# Patient Record
Sex: Male | Born: 1955 | Race: White | Hispanic: No | Marital: Single | Smoking: Former smoker
Health system: Southern US, Community
[De-identification: ages and names within clinical notes are randomized; demographics above are authoritative.]

## PROBLEM LIST (undated history)

## (undated) DIAGNOSIS — F17200 Nicotine dependence, unspecified, uncomplicated: Secondary | ICD-10-CM

## (undated) DIAGNOSIS — M109 Gout, unspecified: Secondary | ICD-10-CM

## (undated) HISTORY — DX: Gout, unspecified: M10.9

## (undated) HISTORY — DX: Nicotine dependence, unspecified, uncomplicated: F17.200

---

## 2016-10-08 ENCOUNTER — Encounter: Payer: Self-pay | Admitting: Family Medicine

## 2016-10-08 ENCOUNTER — Ambulatory Visit (INDEPENDENT_AMBULATORY_CARE_PROVIDER_SITE_OTHER): Payer: BLUE CROSS/BLUE SHIELD | Admitting: Family Medicine

## 2016-10-08 VITALS — BP 138/96 | HR 84 | Temp 99.4°F | Wt 206.8 lb

## 2016-10-08 DIAGNOSIS — J111 Influenza due to unidentified influenza virus with other respiratory manifestations: Secondary | ICD-10-CM

## 2016-10-08 DIAGNOSIS — Z720 Tobacco use: Secondary | ICD-10-CM

## 2016-10-08 NOTE — Progress Notes (Signed)
   Subjective:    Patient ID: Steve Jones, male    DOB: 1956-05-07, 61 y.o.   MRN: [id number]  HPI This is a 61 yo male who presents today abrupt onset 5 days ago of fever (subjective), fatigue and muscle aches. His girlfriend had a course of Tamiflu which he started the day his symptoms began. Was feeling a little better 3 days ago then felt worse. Feels a little better today. Smokes and drinks on Fridays. Smokes less than 1 ppd, has been contemplating quitting.  Rarely gets ill, no hospitalizations or surgeries. Is establishing care with Debb[facility] Alaregina Bailty later this month.   No past medical history on file. No past surgical history on file. No family history on file. Social History  Substance Use Topics  . Smoking status: Current Every Day Smoker  . Smokeless tobacco: Former NeurosurgeonUser  . Alcohol use Yes      Review of Systems  Constitutional: Positive for appetite change, chills, fatigue and fever.  HENT: Positive for rhinorrhea.   Respiratory: Positive for cough (little sputum, occasional cough) and wheezing (at beginning of illness, resolved now). Negative for shortness of breath.   Cardiovascular: Negative for chest pain.  Gastrointestinal: Positive for nausea. Negative for diarrhea and vomiting.  Neurological: Negative for headaches.       Objective:   Physical Exam  Constitutional: He is oriented to person, place, and time. He appears well-developed and well-nourished. No distress.  HENT:  Head: Normocephalic and atraumatic.  Right Ear: External ear normal.  Left Ear: External ear normal.  Nose: Nose normal.  Mouth/Throat: Oropharynx is clear and moist. No oropharyngeal exudate.  Eyes: Conjunctivae are normal.  Neck: Normal range of motion. Neck supple.  Cardiovascular: Normal rate, regular rhythm and normal heart sounds.   Pulmonary/Chest: Effort normal and breath sounds normal. No respiratory distress. He has no wheezes. He has no rales.  Musculoskeletal: Normal range  of motion.  Neurological: He is alert and oriented to person, place, and time.  Skin: Skin is warm and dry. He is not diaphoretic.  Psychiatric: He has a normal mood and affect. His behavior is normal. Judgment and thought content normal.  Vitals reviewed.     BP (!) 138/96   Pulse 84   Temp 99.4 F (37.4 C) (Oral)   Wt 206 lb 12 oz (93.8 kg)   SpO2 95%      Assessment & Plan:  1. Influenza - Provided written and verbal information regarding diagnosis and treatment. - finish Tamiflu - symptomatic treatment discussed - RTC if signs/symptoms of secondary infection- fever, increased sputum, SOB/wheeze  2. Tobacco abuse disorder - discussed smoking cessation and encouraged him to quit - provided written tips for quitting   Olean Reeeborah Gessner, FNP-BC  [facility] [place], [facility]  10/08/2016 10:34 AM

## 2016-10-08 NOTE — Patient Instructions (Signed)
Influenza, Adult Influenza, more commonly known as "the flu," is a viral infection that primarily affects the respiratory tract. The respiratory tract includes organs that help you breathe, such as the lungs, nose, and throat. The flu causes many common cold symptoms, as well as a high fever and body aches. The flu spreads easily from person to person (is contagious). Getting a flu shot (influenza vaccination) every year is the best way to prevent influenza. What are the causes? Influenza is caused by a virus. You can catch the virus by:  Breathing in droplets from an infected person's cough or sneeze.  Touching something that was recently contaminated with the virus and then touching your mouth, nose, or eyes.  What increases the risk? The following factors may make you more likely to get the flu:  Not cleaning your hands frequently with soap and water or alcohol-based hand sanitizer.  Having close contact with many people during cold and flu season.  Touching your mouth, eyes, or nose without washing or sanitizing your hands first.  Not drinking enough fluids or not eating a healthy diet.  Not getting enough sleep or exercise.  Being under a high amount of stress.  Not getting a yearly (annual) flu shot.  You may be at a higher risk of complications from the flu, such as a severe lung infection (pneumonia), if you:  Are over the age of 65.  Are pregnant.  Have a weakened disease-fighting system (immune system). You may have a weakened immune system if you: ? Have HIV or AIDS. ? Are undergoing chemotherapy. ? Aretaking medicines that reduce the activity of (suppress) the immune system.  Have a long-term (chronic) illness, such as heart disease, kidney disease, diabetes, or lung disease.  Have a liver disorder.  Are obese.  Have anemia.  What are the signs or symptoms? Symptoms of this condition typically last 4-10 days and may  include:  Fever.  Chills.  Headache, body aches, or muscle aches.  Sore throat.  Cough.  Runny or congested nose.  Chest discomfort and cough.  Poor appetite.  Weakness or tiredness (fatigue).  Dizziness.  Nausea or vomiting.  How is this diagnosed? This condition may be diagnosed based on your medical history and a physical exam. Your health care provider may do a nose or throat swab test to confirm the diagnosis. How is this treated? If influenza is detected early, you can be treated with antiviral medicine that can reduce the length of your illness and the severity of your symptoms. This medicine may be given by mouth (orally) or through an IV tube that is inserted in one of your veins. The goal of treatment is to relieve symptoms by taking care of yourself at home. This may include taking over-the-counter medicines, drinking plenty of fluids, and adding humidity to the air in your home. In some cases, influenza goes away on its own. Severe influenza or complications from influenza may be treated in a hospital. Follow these instructions at home:  Take over-the-counter and prescription medicines only as told by your health care provider.  Use a cool mist humidifier to add humidity to the air in your home. This can make breathing easier.  Rest as needed.  Drink enough fluid to keep your urine clear or pale yellow.  Cover your mouth and nose when you cough or sneeze.  Wash your hands with soap and water often, especially after you cough or sneeze. If soap and water are not available, use hand sanitizer.    work or school as told by your health care provider. Unless you are visiting your health care provider, try to avoid leaving home until your fever has been gone for 24 hours without the use of medicine.  Keep all follow-up visits as told by your health care provider. This is important. How is this prevented?  Getting an annual flu shot is the best way to avoid getting the flu. You may get the flu shot  in late summer, fall, or winter. Ask your health care provider when you should get your flu shot.  Wash your hands often or use hand sanitizer often.  Avoid contact with people who are sick during cold and flu season.  Eat a healthy diet, drink plenty of fluids, get enough sleep, and exercise regularly. Contact a health care provider if:  You develop new symptoms.  You have:  Chest pain.  Diarrhea.  A fever.  Your cough gets worse.  You produce more mucus.  You feel nauseous or you vomit. Get help right away if:  You develop shortness of breath or difficulty breathing.  Your skin or nails turn a bluish color.  You have severe pain or stiffness in your neck.  You develop a sudden headache or sudden pain in your face or ear.  You cannot stop vomiting. This information is not intended to replace advice given to you by your health care provider. Make sure you discuss any questions you have with your health care provider. Document Released: 07/20/2000 Document Revised: 12/29/2015 Document Reviewed: 05/17/2015 Elsevier Interactive Patient Education  2017 [employer].  Steps to Quit Smoking Smoking tobacco can be harmful to your health and can affect almost every organ in your body. Smoking puts you, and those around you, at risk for developing many serious chronic diseases. Quitting smoking is difficult, but it is one of the best things that you can do for your health. It is never too late to quit. What are the benefits of quitting smoking? When you quit smoking, you lower your risk of developing serious diseases and conditions, such as:  Lung cancer or lung disease, such as COPD.  Heart disease.  Stroke.  Heart attack.  Infertility.  Osteoporosis and bone fractures. Additionally, symptoms such as coughing, wheezing, and shortness of breath may get better when you quit. You may also find that you get sick less often because your body is stronger at fighting off  colds and infections. If you are pregnant, quitting smoking can help to reduce your chances of having a baby of low birth weight. How do I get ready to quit? When you decide to quit smoking, create a plan to make sure that you are successful. Before you quit:  Pick a date to quit. Set a date within the next two weeks to give you time to prepare.  Write down the reasons why you are quitting. Keep this list in places where you will see it often, such as on your bathroom mirror or in your car or wallet.  Identify the people, places, things, and activities that make you want to smoke (triggers) and avoid them. Make sure to take these actions:  Throw away all cigarettes at home, at work, and in your car.  Throw away smoking accessories, such as Set designer.  Clean your car and make sure to empty the ashtray.  Clean your home, including curtains and carpets.  Tell your family, friends, and coworkers that you are quitting. Support from your loved ones can  make quitting easier.  Talk with your health care provider about your options for quitting smoking.  Find out what treatment options are covered by your health insurance. What strategies can I use to quit smoking? Talk with your healthcare provider about different strategies to quit smoking. Some strategies include:  Quitting smoking altogether instead of gradually lessening how much you smoke over a period of time. Research shows that quitting "cold [place]key" is more successful than gradually quitting.  Attending in-person counseling to help you build problem-solving skills. You are more likely to have success in quitting if you attend several counseling sessions. Even short sessions of 10 minutes can be effective.  Finding resources and support systems that can help you to quit smoking and remain smoke-free after you quit. These resources are most helpful when you use them often. They can include:  Online chats with a  Veterinary surgeoncounselor.  Telephone quitlines.  Printed Materials engineerself-help materials.  Support groups or group counseling.  Text messaging programs.  Mobile phone applications.  Taking medicines to help you quit smoking. (If you are pregnant or breastfeeding, talk with your health care provider first.) Some medicines contain nicotine and some do not. Both types of medicines help with cravings, but the medicines that include nicotine help to relieve withdrawal symptoms. Your health care provider may recommend:  Nicotine patches, gum, or lozenges.  Nicotine inhalers or sprays.  Non-nicotine medicine that is taken by mouth. Talk with your health care provider about combining strategies, such as taking medicines while you are also receiving in-person counseling. Using these two strategies together makes you more likely to succeed in quitting than if you used either strategy on its own. If you are pregnant or breastfeeding, talk with your health care provider about finding counseling or other support strategies to quit smoking. Do not take medicine to help you quit smoking unless told to do so by your health care provider. What things can I do to make it easier to quit? Quitting smoking might feel overwhelming at first, but there is a lot that you can do to make it easier. Take these important actions:  Reach out to your family and friends and ask that they support and encourage you during this time. Call telephone quitlines, reach out to support groups, or work with a counselor for support.  Ask people who smoke to avoid smoking around you.  Avoid places that trigger you to smoke, such as bars, parties, or smoke-break areas at work.  Spend time around people who do not smoke.  Lessen stress in your life, because stress can be a smoking trigger for some people. To lessen stress, try:  Exercising regularly.  Deep-breathing exercises.  Yoga.  Meditating.  Performing a body scan. This involves closing  your eyes, scanning your body from head to toe, and noticing which parts of your body are particularly tense. Purposefully relax the muscles in those areas.  Download or purchase mobile phone or tablet apps (applications) that can help you stick to your quit plan by providing reminders, tips, and encouragement. There are many free apps, such as QuitGuide from the [employer] developer(Centers for Disease Control and Prevention). You can find other support for quitting smoking (smoking cessation) through smokefree.gov and other websites. How will I feel when I quit smoking? Within the first 24 hours of quitting smoking, you may start to feel some withdrawal symptoms. These symptoms are usually most noticeable 2-3 days after quitting, but they usually do not last beyond 2-3 weeks. Changes or  symptoms that you might experience include:  Mood swings.  Restlessness, anxiety, or irritation.  Difficulty concentrating.  Dizziness.  Strong cravings for sugary foods in addition to nicotine.  Mild weight gain.  Constipation.  Nausea.  Coughing or a sore throat.  Changes in how your medicines work in your body.  A depressed mood.  Difficulty sleeping (insomnia). After the first 2-3 weeks of quitting, you may start to notice more positive results, such as:  Improved sense of smell and taste.  Decreased coughing and sore throat.  Slower heart rate.  Lower blood pressure.  Clearer skin.  The ability to breathe more easily.  Fewer sick days. Quitting smoking is very challenging for most people. Do not get discouraged if you are not successful the first time. Some people need to make many attempts to quit before they achieve long-term success. Do your best to stick to your quit plan, and talk with your health care provider if you have any questions or concerns. This information is not intended to replace advice given to you by your health care provider. Make sure you discuss any questions you have with  your health care provider. Document Released: 07/17/2001 Document Revised: 03/20/2016 Document Reviewed: 12/07/2014 Elsevier Interactive Patient Education  2017 [employer].

## 2016-10-08 NOTE — Progress Notes (Signed)
Pre visit review using our clinic review tool, if applicable. No additional management support is needed unless otherwise documented below in the visit note. 

## 2016-10-19 ENCOUNTER — Ambulatory Visit: Payer: Self-pay | Admitting: Internal Medicine

## 2017-04-09 ENCOUNTER — Ambulatory Visit (INDEPENDENT_AMBULATORY_CARE_PROVIDER_SITE_OTHER): Payer: BLUE CROSS/BLUE SHIELD | Admitting: Family Medicine

## 2017-04-09 ENCOUNTER — Encounter: Payer: Self-pay | Admitting: Family Medicine

## 2017-04-09 VITALS — BP 136/90 | HR 87 | Temp 98.8°F | Ht 71.0 in | Wt 213.0 lb

## 2017-04-09 DIAGNOSIS — Z0001 Encounter for general adult medical examination with abnormal findings: Secondary | ICD-10-CM | POA: Diagnosis not present

## 2017-04-09 DIAGNOSIS — M109 Gout, unspecified: Secondary | ICD-10-CM

## 2017-04-09 DIAGNOSIS — Z7189 Other specified counseling: Secondary | ICD-10-CM

## 2017-04-09 DIAGNOSIS — F17200 Nicotine dependence, unspecified, uncomplicated: Secondary | ICD-10-CM

## 2017-04-09 NOTE — Patient Instructions (Signed)
Check with your insurance to see if they will cover the shingles shot. I would get a flu shot each fall.   Get a tetanus shot either here or at the pharmacy.   Let us know when you want to get set up for a colonoscopy.   Get me a copy of your old labs and we'll go from there.   Let me know if you want HIV or hep C screening.  We offer that for patients.   Take care.  Glad to see you.

## 2017-04-09 NOTE — Progress Notes (Signed)
CPE- See plan.  Routine anticipatory guidance given to patient.  See health maintenance.  The possibility exists that previously documented standard health maintenance information may have been brought forward from a previous encounter into this note.  If needed, that same information has been updated to reflect the current situation based on today's encounter.    Tetanus d/w pt.  He can check on coverage.  Encouraged. Flu usually done at work, encouraged.  Pneumonia vaccine due at 65. Shingles vaccination discussed with patient. See after visit summary. D/w patient [id number]:options for colon cancer screening, including IFOB vs. colonoscopy.  Risks and benefits of both were discussed and patient voiced understanding.  Pt elects to consider. He did not commit. Family history noted. Encouraged colonoscopy given his family history. Rationale discussed with patient. Prostate cancer screening and PSA options (with potential risks and benefits of testing vs not testing) were discussed along with recent recs/guidelines.  He declined testing PSA at this point. Diet and exercise discussed with patient. Encouraged healthy diet. Encouraged routine exercise. Living will discussed with patient. Girlfriend Cinda Questerri Overbey designated if patient were incapacitated. Pt opts to consider HIV screening.  D/w pt re: routine screening.   Pt opts to consider HCV screening.  D/w pt re: routine screening.   Discussed with patient about smoking cessation and tapering alcohol. He tends to smoke more when he drinks alcohol, usually on Fridays after work.  History of gout. No recent flares. Overall fewer symptoms when he had made previous diet changes. He has chronic gouty changes noted on the elbows and knees.  Erectile dysfunction noted by patient. Likely made worse by alcohol and tobacco use.  PMH and SH reviewed  Meds, vitals, and allergies reviewed.   ROS: Per HPI.  Unless specifically indicated otherwise in HPI, the patient  denies:  General: fever. Eyes: acute vision changes ENT: sore throat Cardiovascular: chest pain Respiratory: SOB GI: vomiting GU: dysuria Musculoskeletal: acute back pain Derm: acute rash Neuro: acute motor dysfunction Psych: worsening mood Endocrine: polydipsia Heme: bleeding Allergy: hayfever  GEN: nad, alert and oriented HEENT: mucous membranes moist NECK: supple w/o LA CV: rrr. PULM: ctab, no inc wob ABD: soft, +bs EXT: no edema SKIN: no acute rash Chronic gouty changes noted on the elbows and knees

## 2017-04-10 ENCOUNTER — Encounter: Payer: Self-pay | Admitting: Family Medicine

## 2017-04-10 DIAGNOSIS — M109 Gout, unspecified: Secondary | ICD-10-CM | POA: Insufficient documentation

## 2017-04-10 DIAGNOSIS — Z0000 Encounter for general adult medical examination without abnormal findings: Secondary | ICD-10-CM | POA: Insufficient documentation

## 2017-04-10 DIAGNOSIS — F17200 Nicotine dependence, unspecified, uncomplicated: Secondary | ICD-10-CM | POA: Insufficient documentation

## 2017-04-10 DIAGNOSIS — Z0001 Encounter for general adult medical examination with abnormal findings: Secondary | ICD-10-CM | POA: Insufficient documentation

## 2017-04-10 DIAGNOSIS — Z7189 Other specified counseling: Secondary | ICD-10-CM | POA: Insufficient documentation

## 2017-04-10 MED ORDER — RANITIDINE HCL 150 MG PO TABS: 150.0000 mg | ORAL_TABLET | Freq: Every day | ORAL | Status: DC | PRN

## 2017-04-10 NOTE — Assessment & Plan Note (Signed)
Living will discussed with patient. Girlfriend Terri Overbey designated if patient were incapacitated. ?

## 2017-04-10 NOTE — Assessment & Plan Note (Signed)
Tetanus d/w pt.  He can check on coverage.  Encouraged. Flu usually done at work, encouraged.  Pneumonia vaccine due at 65. Shingles vaccination discussed with patient. See after visit summary. D/w patient [id number]:options for colon cancer screening, including IFOB vs. colonoscopy.  Risks and benefits of both were discussed and patient voiced understanding.  Pt elects to consider. He did not commit. Family history noted. Encouraged colonoscopy given his family history. Rationale discussed with patient. Prostate cancer screening and PSA options (with potential risks and benefits of testing vs not testing) were discussed along with recent recs/guidelines.  He declined testing PSA at this point. Diet and exercise discussed with patient. Encouraged healthy diet. Encouraged routine exercise. Living will discussed with patient. Girlfriend Cinda Questerri Overbey designated if patient were incapacitated. Pt opts to consider HIV screening.  D/w pt re: routine screening.   Pt opts to consider HCV screening.  D/w pt re: routine screening.   Discussed with patient about smoking cessation and tapering alcohol. He tends to smoke more when he drinks alcohol, usually on Fridays after work.

## 2017-04-12 ENCOUNTER — Other Ambulatory Visit: Payer: Self-pay

## 2017-04-12 MED ORDER — COLCHICINE 0.6 MG PO TABS: 0.6000 mg | ORAL_TABLET | Freq: Two times a day (BID) | ORAL | 0 refills | Status: DC | PRN

## 2017-04-12 NOTE — Telephone Encounter (Signed)
Sent. Thanks.   

## 2017-04-12 NOTE — Telephone Encounter (Signed)
Steve Jones (DPR signed) left v/m; pt established care on 04/09/17. Last night pt began with bad flare up of gout in foot and ankle. Pt had one pill left of gout med and [facility]. Terry request cb when refilled. Colchicine is on med list.

## 2018-01-06 ENCOUNTER — Other Ambulatory Visit: Payer: Self-pay | Admitting: Family Medicine

## 2018-01-06 NOTE — Telephone Encounter (Signed)
See refill encounter from 01/06/18.

## 2018-01-06 NOTE — Telephone Encounter (Signed)
Electronic refill request Last refill 04/12/17 #60 Last office visit 04/09/17

## 2018-01-06 NOTE — Telephone Encounter (Signed)
Copied from CRM [phone]. Topic: Quick Communication - Rx Refill/Question >> Jan 06, 2018 12:28 PM Darletta MollLander, Lumin L wrote: Medication: colchicine (COLCRYS) 0.6 MG tablet  Has the patient contacted their pharmacy? Yes.   (Agent: If no, request that the patient contact the pharmacy for the refill.) (Agent: If yes, when and what did the pharmacy advise?)  Preferred Pharmacy (with phone number or street name): [facility] [place] - [address] [place] [id number] Phone: [phone] Fax: [phone]  Agent: Please be advised that RX refills may take up to 3 business days. We ask that you follow-up with your pharmacy.  Patient have having a gout flare up experiencing a lot of pain.

## 2018-01-07 NOTE — Telephone Encounter (Signed)
Left detailed message on voicemail.  

## 2018-01-07 NOTE — Telephone Encounter (Signed)
Sent.  If needed frequently then needs OV.   Thanks.

## 2018-09-04 ENCOUNTER — Telehealth: Payer: Self-pay | Admitting: Family Medicine

## 2018-09-04 NOTE — Telephone Encounter (Signed)
Pt wants to have labs done [facility] for cpx  09/15/2018  Please mail lab order to pt

## 2018-09-08 ENCOUNTER — Encounter: Payer: Self-pay | Admitting: *Deleted

## 2018-09-08 NOTE — Telephone Encounter (Signed)
Order mailed to patient as requested.

## 2018-09-08 NOTE — Telephone Encounter (Signed)
Please send order in a letter for the following.    cmet M10.9.  Lipid Z13.220.  Uric acid M10.9.   Thanks.

## 2018-09-10 ENCOUNTER — Encounter: Payer: Self-pay | Admitting: Family Medicine

## 2018-09-10 ENCOUNTER — Other Ambulatory Visit: Payer: BLUE CROSS/BLUE SHIELD

## 2018-09-10 LAB — LAB REPORT - SCANNED
CHOLESTEROL: 202
Creatinine, Ser: 1.22
Glucose: 87
HDL: 46
LDL (calc): 112
SGOT(AST): 32
SGPT (ALT): 44
TRIGLYCERIDES: 218 — AB (ref 40–160)
URIC ACID: 7.2

## 2018-09-15 ENCOUNTER — Ambulatory Visit (INDEPENDENT_AMBULATORY_CARE_PROVIDER_SITE_OTHER): Payer: BLUE CROSS/BLUE SHIELD | Admitting: Family Medicine

## 2018-09-15 ENCOUNTER — Encounter: Payer: Self-pay | Admitting: Family Medicine

## 2018-09-15 VITALS — BP 152/84 | HR 79 | Temp 98.5°F | Ht 71.0 in

## 2018-09-15 DIAGNOSIS — Z0001 Encounter for general adult medical examination with abnormal findings: Secondary | ICD-10-CM | POA: Diagnosis not present

## 2018-09-15 DIAGNOSIS — Z7189 Other specified counseling: Secondary | ICD-10-CM

## 2018-09-15 DIAGNOSIS — E785 Hyperlipidemia, unspecified: Secondary | ICD-10-CM | POA: Diagnosis not present

## 2018-09-15 DIAGNOSIS — M7650 Patellar tendinitis, unspecified knee: Secondary | ICD-10-CM | POA: Diagnosis not present

## 2018-09-15 DIAGNOSIS — M109 Gout, unspecified: Secondary | ICD-10-CM | POA: Diagnosis not present

## 2018-09-15 DIAGNOSIS — H9390 Unspecified disorder of ear, unspecified ear: Secondary | ICD-10-CM

## 2018-09-15 MED ORDER — COLCHICINE 0.6 MG PO TABS: ORAL_TABLET | ORAL | 2 refills | Status: DC

## 2018-09-15 MED ORDER — FAMOTIDINE 20 MG PO TABS: 20.0000 mg | ORAL_TABLET | Freq: Two times a day (BID) | ORAL | Status: DC

## 2018-09-15 NOTE — Progress Notes (Signed)
CPE- See plan.  Routine anticipatory guidance given to patient.  See health maintenance.  The possibility exists that previously documented standard health maintenance information may have been brought forward from a previous encounter into this note.  If needed, that same information has been updated to reflect the current situation based on today's encounter.    Tetanus d/w pt.  He can check on coverage.  Encouraged. Flu usually done at work, done ~05/2018.  Pneumonia vaccine due at 65. Shingles vaccination discussed with patient. See after visit summary. Colon cancer screening d/w pt. Colonoscopy encouraged.  FH noted.   Prostate cancer screening and PSA options (with potential risks and benefits of testing vs not testing) were discussed along with recent recs/guidelines.  He declined testing PSA at this point. Diet and exercise discussed with patient. Encouraged healthy diet. Encouraged routine exercise. Living will discussed with patient. Girlfriend Cinda Quest designated if patient were incapacitated. Discussed again with patient about smoking cessation and tapering alcohol. He tends to smoke more when he drinks alcohol, usually on Fridays after work.  H/o gout.  Used colchicine prn.  Has a flare a few times a year.  Colchicine helps but he had have diarrhea on that.    HLD d/w pt.  Encouraged diet and exercise.    H/o poorly healing lesion in the L pinna for months.  D/w pt about ENT eval, referred.   He has some occasional left knee pain that is different from gout.  It is inferior to the patella.  PMH and SH reviewed  Meds, vitals, and allergies reviewed.   ROS: Per HPI.  Unless specifically indicated otherwise in HPI, the patient denies:  General: fever. Eyes: acute vision changes ENT: sore throat Cardiovascular: chest pain Respiratory: SOB GI: vomiting GU: dysuria Musculoskeletal: acute back pain Derm: acute rash Neuro: acute motor dysfunction Psych: worsening  mood Endocrine: polydipsia Heme: bleeding Allergy: hayfever  GEN: nad, alert and oriented HEENT: mucous membranes moist, small ulcerated lesion noted on the left pinna. NECK: supple w/o LA CV: rrr. PULM: ctab, no inc wob ABD: soft, +bs EXT: no edema SKIN: no acute rash L quad ligament ttp.  Patella not tender to palpation.  Joint line not tender.  Tibial tubercle not tender.  No swelling or erythema locally. Chronic gout changes on B elbows.

## 2018-09-15 NOTE — Patient Instructions (Addendum)
Check with your insurance to see if they will cover the tetanus and shingrix shot. Thanks for getting a flu shot at work.   Check to see about colonoscopy coverage and let me know if you need help getting an appointment.    Try a jumpers knee strap and ice as needed.    We make arrangements for referrals, extra imaging, and other appointments based on the urgency of the situation. Referrals are handled based on the clinical situation, not in the order that they are placed. If you do not see one of our referral coordinators on the way out of the clinic today, then you should expect a call in the next 1 to 2 weeks. We work diligently to process all referrals as quickly as possible.    Take care.  Glad to see you.

## 2018-09-16 ENCOUNTER — Telehealth: Payer: Self-pay

## 2018-09-16 MED ORDER — COLCHICINE 0.6 MG PO TABS: ORAL_TABLET | ORAL | 2 refills | Status: DC

## 2018-09-16 NOTE — Telephone Encounter (Signed)
Terry advised.

## 2018-09-16 NOTE — Telephone Encounter (Signed)
Terri, (DPR signed) pts ins will not cover colchicine, cost to pt is around $300.00. Terri checked with ins co and ins will cover mitigare. [facility].Please advise. Pt seen 09/15/18.

## 2018-09-16 NOTE — Telephone Encounter (Signed)
Resent.  Thanks.

## 2018-09-17 DIAGNOSIS — M7650 Patellar tendinitis, unspecified knee: Secondary | ICD-10-CM | POA: Insufficient documentation

## 2018-09-17 DIAGNOSIS — E785 Hyperlipidemia, unspecified: Secondary | ICD-10-CM | POA: Insufficient documentation

## 2018-09-17 DIAGNOSIS — H9390 Unspecified disorder of ear, unspecified ear: Secondary | ICD-10-CM | POA: Insufficient documentation

## 2018-09-17 NOTE — Assessment & Plan Note (Signed)
Tetanus d/w pt.  He can check on coverage.  Encouraged. Flu usually done at work, done ~05/2018.  Pneumonia vaccine due at 65. Shingles vaccination discussed with patient. See after visit summary. Colon cancer screening d/w pt. Colonoscopy encouraged.  FH noted.   Prostate cancer screening and PSA options (with potential risks and benefits of testing vs not testing) were discussed along with recent recs/guidelines.  He declined testing PSA at this point. Diet and exercise discussed with patient. Encouraged healthy diet. Encouraged routine exercise. Living will discussed with patient. Girlfriend Cinda Quest designated if patient were incapacitated. Discussed again with patient about smoking cessation and tapering alcohol. He tends to smoke more when he drinks alcohol, usually on Fridays after work.

## 2018-09-17 NOTE — Assessment & Plan Note (Signed)
Concern would be for a small squamous cell cancer.  Discussed with patient.  Refer to ENT.  He may need excision /biopsy.

## 2018-09-17 NOTE — Telephone Encounter (Signed)
Steve Jones at [facility] left v/m 09/16/18 at 5:48 pm that colchicine is too expensive. Dr Para March had sent the rx back with a note could fill with colchicine/mitigare/ or colcrys. Left v/m on [facility] with that info and if pharmacy needs to cb they can.

## 2018-09-17 NOTE — Assessment & Plan Note (Signed)
HLD d/w pt.  Encouraged diet and exercise.

## 2018-09-17 NOTE — Telephone Encounter (Signed)
Steve Jones ran the generic mitigare and it was not covered; advised the note from Steve Jones saying mitigare was covered. Steve Jones ran the name brand mitigare and it is covered; nothing further needed.

## 2018-09-17 NOTE — Assessment & Plan Note (Signed)
Living will discussed with patient. Girlfriend Terri Overbey designated if patient were incapacitated. ?

## 2018-09-17 NOTE — Assessment & Plan Note (Signed)
Labs discussed with patient.  Encouraged alcohol taper.  Gout handout given to patient.  Uric acid is reasonable.  Continue colchicine as needed.

## 2018-09-17 NOTE — Assessment & Plan Note (Signed)
Discussed with patient about anatomy. He can try a jumpers knee strap and ice as needed.   Update me as needed.  He agreed.

## 2018-09-22 ENCOUNTER — Encounter: Payer: Self-pay | Admitting: Family Medicine

## 2018-12-11 ENCOUNTER — Other Ambulatory Visit: Payer: Self-pay

## 2018-12-11 ENCOUNTER — Encounter: Payer: Self-pay | Admitting: Family Medicine

## 2018-12-11 ENCOUNTER — Ambulatory Visit: Payer: BLUE CROSS/BLUE SHIELD | Admitting: Family Medicine

## 2018-12-11 DIAGNOSIS — M25519 Pain in unspecified shoulder: Secondary | ICD-10-CM | POA: Diagnosis not present

## 2018-12-11 NOTE — Progress Notes (Signed)
Shoulder pain.  More AM pain.  R sided shoulder pain.  No L sided sx.  Started in the last week or so.  H/o similar that self resolved in the distant past.  No specific injury recently.  Pain with overhead motion.  Has been doing housework/yardwork recently.  Pain sleeping on R side.    No FCNAVD.  No covid concerns due to illness but he was furloughed at work for the last 5 weeks.    Meds, vitals, and allergies reviewed.   ROS: Per HPI unless specifically indicated in ROS section   nad ncat Neck supple, no LA, normal range of motion Right shoulder exam with AROM with pain, no pain with PROM, especially with overhead movements. Supraspinatus testing positive. No arm drop Distally NV intact.   Decreased in shoulder pain on active range of motion with scapular manipulation.

## 2018-12-11 NOTE — Patient Instructions (Signed)
Likely rotator cuff irritation.  Use the shoulder exercises and update me if not better.  Take care.  Glad to see you.

## 2018-12-14 NOTE — Assessment & Plan Note (Signed)
Likely rotator cuff irritation.  He will use the handout for home shoulder exercises and update me if not better.  Handout discussed and anatomy discussed.  Exercises demonstrated.  He agreed with plan. He clearly had decreased pain on active range of motion with scapular manipulation at time of exam.

## 2019-02-09 ENCOUNTER — Ambulatory Visit: Payer: BC Managed Care – PPO | Admitting: Family Medicine

## 2019-02-09 ENCOUNTER — Encounter: Payer: Self-pay | Admitting: Family Medicine

## 2019-02-09 ENCOUNTER — Other Ambulatory Visit: Payer: Self-pay

## 2019-02-09 ENCOUNTER — Ambulatory Visit (INDEPENDENT_AMBULATORY_CARE_PROVIDER_SITE_OTHER)
Admission: RE | Admit: 2019-02-09 | Discharge: 2019-02-09 | Disposition: A | Discharge status: Home / Self Care | Payer: BC Managed Care – PPO | Source: Ambulatory Visit | Attending: Family Medicine | Admitting: Family Medicine

## 2019-02-09 VITALS — BP 146/100 | HR 86 | Temp 99.6°F | Ht 71.0 in | Wt 215.2 lb

## 2019-02-09 DIAGNOSIS — M25511 Pain in right shoulder: Secondary | ICD-10-CM | POA: Diagnosis not present

## 2019-02-09 DIAGNOSIS — R29898 Other symptoms and signs involving the musculoskeletal system: Secondary | ICD-10-CM

## 2019-02-09 DIAGNOSIS — M7501 Adhesive capsulitis of right shoulder: Secondary | ICD-10-CM | POA: Diagnosis not present

## 2019-02-09 NOTE — Patient Instructions (Signed)
REFERRALS TO SPECIALISTS, SPECIAL TESTS (MRI, CT, ULTRASOUNDS)  MARION or  Anastasiya will help you. ASK CHECK-IN FOR HELP.  Specialist appointment times vary a great deal, based on their schedule / openings. -- Some specialists have very long wait times. (Example. Dermatology)    

## 2019-02-09 NOTE — Progress Notes (Signed)
Jayshun Galentine T. Delmore Sear, MD Primary Care and Sports Medicine [facility], [place] [address] [place], [id number] Phone: [phone]  FAX: [phone]  Nuala Alphahillip Bielinski - 63 y.o. male  MRN [id number]  Date of Birth: 09/25/1955  Visit Date: 02/09/2019  PCP: Joaquim Namuncan, Graham S, MD  Referred by: Joaquim Namuncan, Graham S, MD  Chief Complaint  Patient presents with  . Shoulder Pain  . Hand Swelling   Subjective:   Nuala Alphahillip Bulluck is a 63 y.o. very pleasant male patient who presents with the following:  Minimal movement in the R shoulder.  Has a known history of gout medication.    Shoulder started for about 3 months.  Was able to play golf initially.  At this point he has minimal ability to abduct and flex his shoulder.  Does also have some loss of motion.  He is having constant pain almost at all times.  At baseline he is a very active guy and plays a lot of golf.  Is also able to walk and have good mobility and motion without any significant problems.  He is never had any specific problems with the shoulder, and is not had any significant known trauma.  H/o softball and baseball.     Past Medical History, Surgical History, Social History, Family History, Problem List, Medications, and Allergies have been reviewed and updated if relevant.  Patient Active Problem List   Diagnosis Date Noted  . Shoulder pain 12/11/2018  . Ear lesion 09/17/2018  . HLD (hyperlipidemia) 09/17/2018  . 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 Encounter for general adult medical examination with abnormal findings 04/10/2017  . Advance care planning 04/10/2017  . Smoker   . Gout     Past Medical History:  Diagnosis Date  . Gout   . Smoker     History reviewed. No pertinent surgical history.  Social History   Socioeconomic History  . Marital status: Single    Spouse name: Not on file  . Number of children: Not on file  . Years of education: Not on file  . Highest education  level: Not on file  Occupational History  . Not on file  Social Needs  . Financial resource strain: Not on file  . Food insecurity    Worry: Not on file    Inability: Not on file  . Transportation needs    Medical: Not on file    Non-medical: Not on file  Tobacco Use  . Smoking status: Current Every Day Smoker  . Smokeless tobacco: Former Engineer, waterUser  Substance and Sexual Activity  . Alcohol use: Yes  . Drug use: No  . Sexual activity: Not on file  Lifestyle  . Physical activity    Days per week: Not on file    Minutes per session: Not on file  . Stress: Not on file  Relationships  . Social Musicianconnections    Talks on phone: Not on file    Gets together: Not on file    Attends religious service: Not on file    Active member of club or organization: Not on file    Attends meetings of clubs or organizations: Not on file    Relationship status: Not on file  . Intimate partner violence    Fear of current or ex partner: Not on file    Emotionally abused: Not on file    Physically abused: Not on file    Forced sexual activity: Not on file  Other Topics  Concern  . Not on file  Social History Narrative   Lives with his girlfriend Theressa Stamps   No kids.    From [place].   Works in Sales executive at [employer]    Family History  Problem Relation Age of Onset  . Colon cancer Father   . Aneurysm Brother   . Prostate cancer Neg Hx     No Known Allergies  Medication list reviewed and updated in full in [place].  GEN: No fevers, chills. Nontoxic. Primarily MSK c/o today. MSK: Detailed in the HPI GI: tolerating PO intake without difficulty Neuro: No numbness, parasthesias, or tingling associated. Otherwise the pertinent positives of the ROS are noted above.   Objective:   BP (!) 146/100   Pulse 86   Temp 99.6 F (37.6 C) (Temporal)   Ht 5\' 11"  (1.803 m)   Wt 215 lb 4 oz (97.6 kg)   SpO2 96%   BMI 30.02 kg/m    GEN: WDWN, NAD, Non-toxic, Alert  & Oriented x 3 HEENT: Atraumatic, Normocephalic.  Ears and Nose: No external deformity. EXTR: No clubbing/cyanosis/edema NEURO: Normal gait.  PSYCH: Normally interactive. Conversant. Not depressed or anxious appearing.  Calm demeanor.    Right clavicle is nontender.  The [facility] joint is nontender.  The humerus and the humeral head are nontender.  When attempting to abduct the shoulder the patient has almost no motion in abduction, but passively I am able to get the shoulder to 100 degrees.  Strength is 1+ to 2- on the right abduction.  Flexion is almost identical to abduction.  Internal and external range of motion are 4 out of 5.  Drop test is positive on the right.  Radiology: No results found.  Assessment and Plan:     ICD-10-CM   1. Acute pain of right shoulder  M25.511 DG Shoulder Right    MR Shoulder Right Wo Contrast  2. Adhesive capsulitis of right shoulder  M75.01 DG Shoulder Right  3. Shoulder weakness  R29.898 DG Shoulder Right   Level of Medical Decision-Making in this case is Moderate.   Highly suspect full-thickness versus partial thickness rotator cuff tear in the right shoulder.  Obtain an MRI of the right shoulder without contrast to evaluate integrity of the rotator cuff.  Massive rotator cuff tear possible, consideration of operative treatment.  There is certainly a component of some loss of motion as well with some adhesive capsulitis, but this is not the primary problem.  Follow-up: No follow-ups on file.  No orders of the defined types were placed in this encounter.  Orders Placed This Encounter  Procedures  . DG Shoulder Right  . MR Shoulder Right Wo Contrast    Signed,  Larina Lieurance T. Marquavious Nazar, MD   Outpatient Encounter Medications as of 02/09/2019  Medication Sig  . colchicine 0.6 MG tablet TAKE 1 TABLET BY MOUTH TWICE (2) DAILY AS NEEDED  . famotidine (PEPCID) 20 MG tablet Take 1 tablet (20 mg total) by mouth 2 (two) times daily.  Marland Kitchen ibuprofen  (ADVIL,MOTRIN) 200 MG tablet Take 200 mg by mouth as needed.   No facility-administered encounter medications on file as of 02/09/2019.

## 2019-03-16 ENCOUNTER — Encounter: Payer: Self-pay | Admitting: Family Medicine

## 2019-05-06 ENCOUNTER — Other Ambulatory Visit: Payer: Self-pay | Admitting: Family Medicine

## 2019-05-06 NOTE — Telephone Encounter (Signed)
Sent. Thanks.  Was on med list as colchicine.

## 2019-05-06 NOTE — Telephone Encounter (Signed)
Electronic refill request Last office visit 02/09/19 acute Medication is no longer on her list

## 2019-08-30 IMAGING — DX RIGHT SHOULDER - 2+ VIEW
2 series · 2 of 2 positions shown · non-contrast
Comparison: None

CLINICAL DATA: Suspected full-thickness rotator cuff tear RIGHT
shoulder

EXAM:
RIGHT SHOULDER - 2+ VIEW

[shoulder ap]
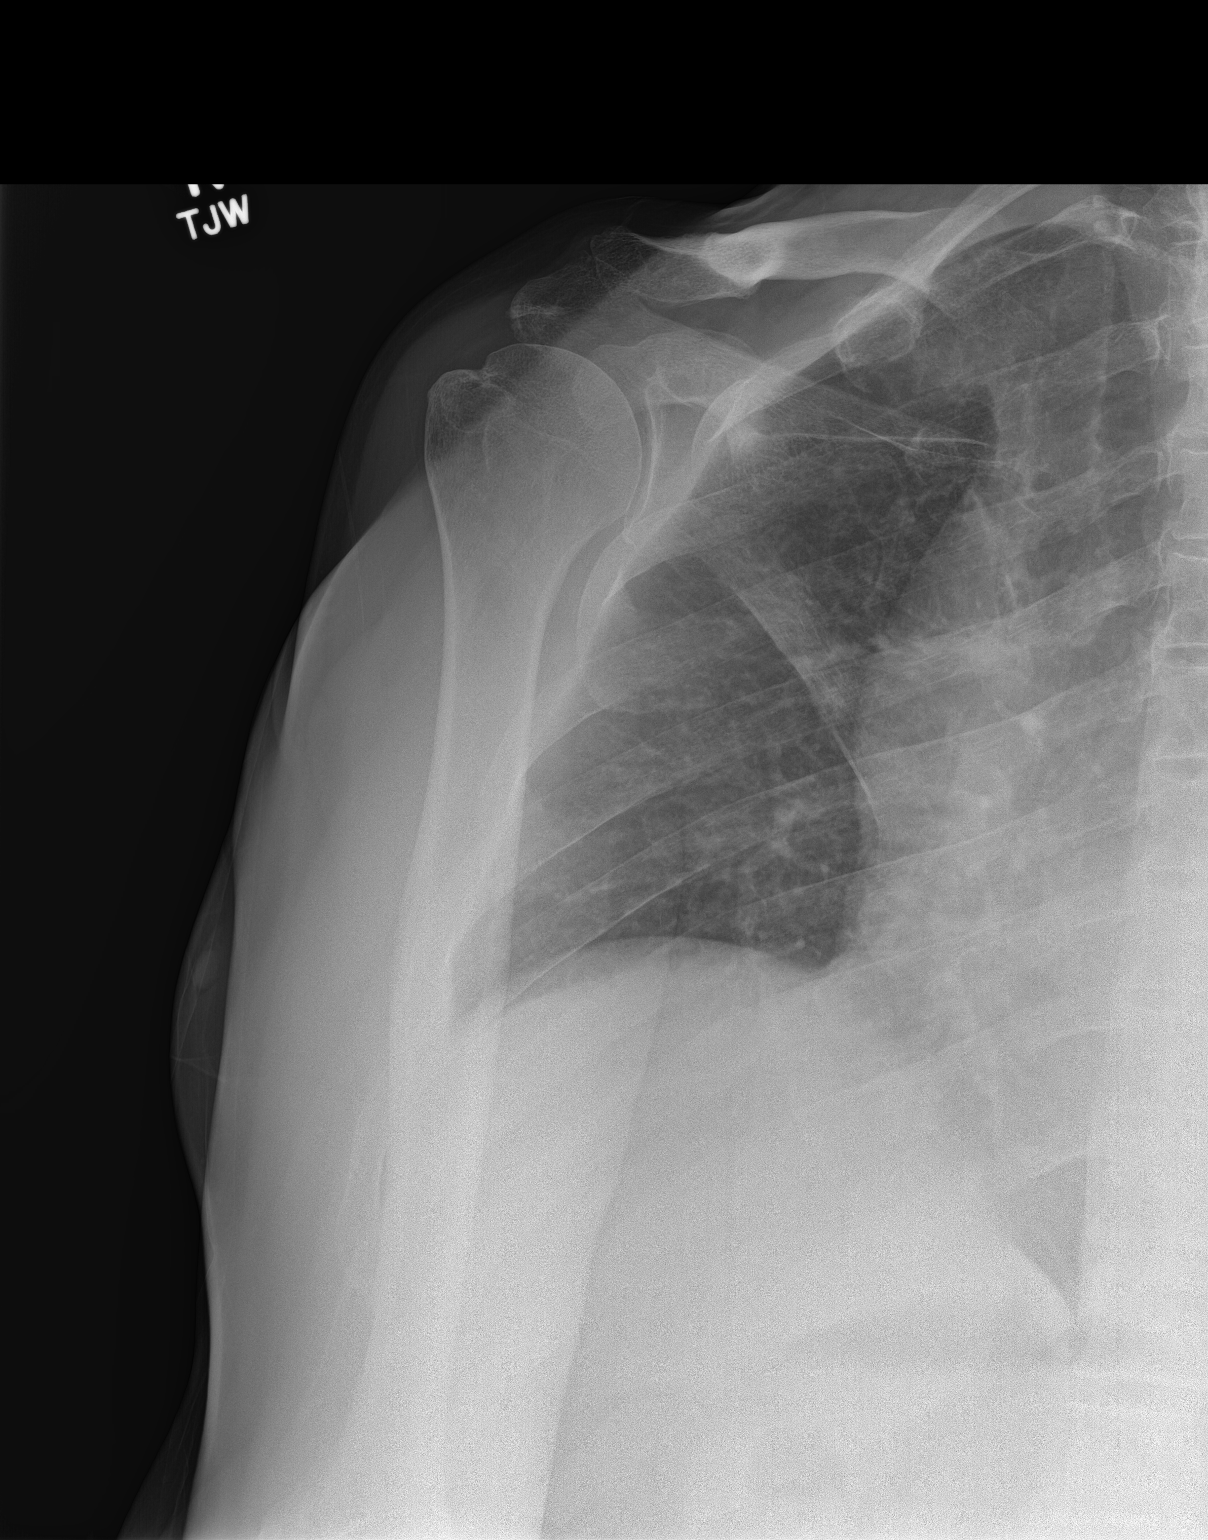

[shoulder y-view]
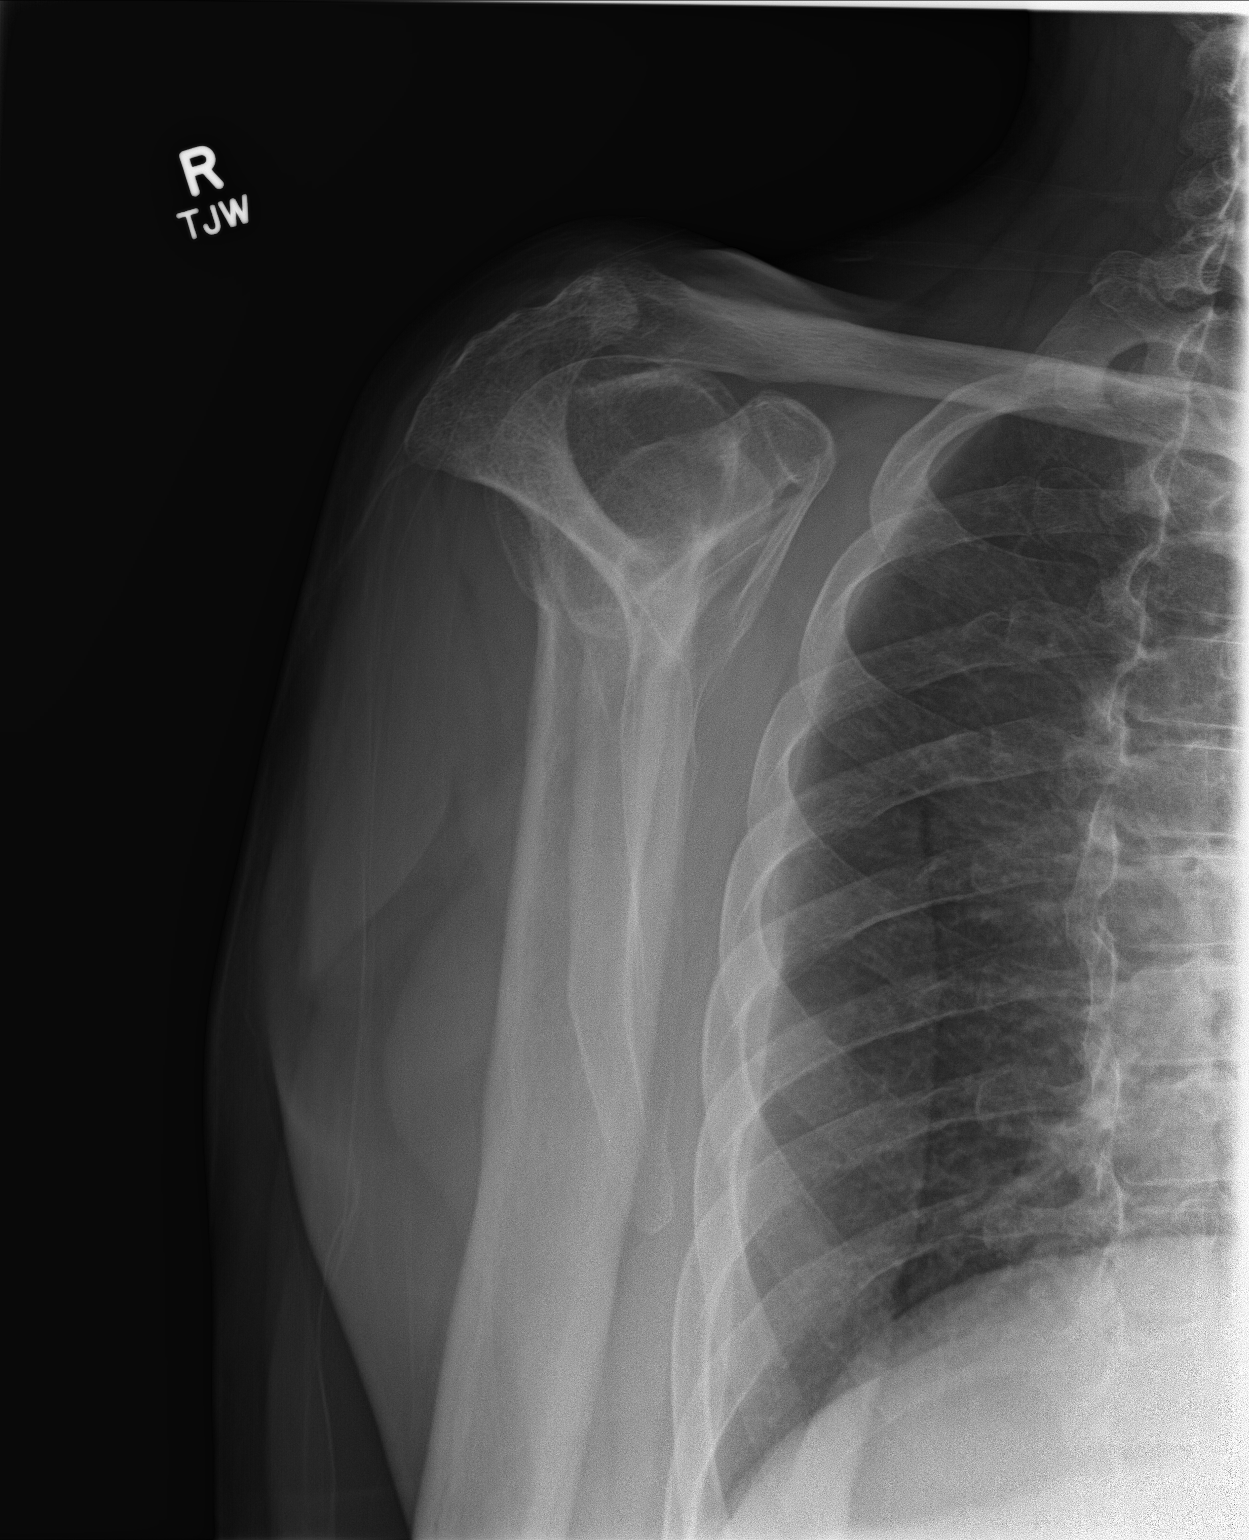

[2 of 2 positions shown; findings below may reference images not displayed]

FINDINGS: Osseous demineralization.

Degenerative changes RIGHT AC joint.

Visualized RIGHT ribs intact.

No fracture, dislocation or bone destruction.
IMPRESSION: Degenerative changes RIGHT AC joint.

## 2019-11-01 ENCOUNTER — Ambulatory Visit: Payer: BC Managed Care – PPO | Attending: Internal Medicine

## 2019-11-01 DIAGNOSIS — Z23 Encounter for immunization: Secondary | ICD-10-CM

## 2019-11-01 NOTE — Progress Notes (Signed)
   Covid-19 Vaccination Clinic  Name:  Steve Jones    MRN: [id number] DOB: 1956-07-15  11/01/2019  [name] was observed post Covid-19 immunization for 15 minutes without incident. He was provided with Vaccine Information Sheet and instruction to access the V-Safe system.   [name] was instructed to call 911 with any severe reactions post vaccine: Marland Kitchen Difficulty breathing  . Swelling of face and throat  . A fast heartbeat  . A bad rash all over body  . Dizziness and weakness   Immunizations Administered    Name Date Dose VIS Date Route   Pfizer COVID-19 Vaccine 11/01/2019  9:44 AM 0.3 mL 07/17/2019 Intramuscular   Manufacturer: [employer], [employer]   Lot: [id number]   NDC: [address]-5

## 2019-11-19 ENCOUNTER — Other Ambulatory Visit: Payer: Self-pay | Admitting: Family Medicine

## 2019-11-20 NOTE — Telephone Encounter (Addendum)
Electronic refill request. Mitigare Last office visit:   02/09/2019 Acute - [place] Last Filled:    60 capsule 1 05/06/2019  Please advise.

## 2019-11-20 NOTE — Telephone Encounter (Signed)
Sent. Thanks.  Please schedule CPE later this year when possible.

## 2019-11-23 NOTE — Telephone Encounter (Signed)
Called and left voicemail for patient to call back to get scheduled for CPE and labs.

## 2019-11-24 ENCOUNTER — Ambulatory Visit: Payer: BC Managed Care – PPO | Attending: Internal Medicine

## 2019-11-24 DIAGNOSIS — Z23 Encounter for immunization: Secondary | ICD-10-CM

## 2019-11-24 NOTE — Progress Notes (Signed)
   Covid-19 Vaccination Clinic  Name:  Steve Jones    MRN: [id number] DOB: Sep 08, 1955  11/24/2019  Mr. Steve Jones was observed post Covid-19 immunization for 15 minutes without incident. He was provided with Vaccine Information Sheet and instruction to access the V-Safe system.   Mr. Steve Jones was instructed to call 911 with any severe reactions post vaccine: Marland Kitchen Difficulty breathing  . Swelling of face and throat  . A fast heartbeat  . A bad rash all over body  . Dizziness and weakness   Immunizations Administered    Name Date Dose VIS Date Route   Pfizer COVID-19 Vaccine 11/24/2019  3:38 PM 0.3 mL 09/30/2018 Intramuscular   Manufacturer: [employer], [employer]   Lot: [id number]   NDC: [address]-0

## 2019-11-25 NOTE — Telephone Encounter (Signed)
Called patient on both numbers but got no answer and no voicemail box to leave a message. Patient still needs to be scheduled for cpe and labs.

## 2020-01-29 ENCOUNTER — Encounter: Payer: Self-pay | Admitting: Family Medicine

## 2020-01-29 NOTE — Telephone Encounter (Signed)
Letter mailed to get patient scheduled.

## 2020-03-15 ENCOUNTER — Ambulatory Visit
Admission: EM | Admit: 2020-03-15 | Discharge: 2020-03-15 | Disposition: A | Discharge status: Home / Self Care | Payer: BC Managed Care – PPO | Attending: Emergency Medicine | Admitting: Emergency Medicine

## 2020-03-15 DIAGNOSIS — R030 Elevated blood-pressure reading, without diagnosis of hypertension: Secondary | ICD-10-CM

## 2020-03-15 DIAGNOSIS — J209 Acute bronchitis, unspecified: Secondary | ICD-10-CM | POA: Diagnosis not present

## 2020-03-15 MED ORDER — PREDNISONE 10 MG PO TABS: 40.0000 mg | ORAL_TABLET | Freq: Every day | ORAL | 0 refills | Status: AC

## 2020-03-15 MED ORDER — ALBUTEROL SULFATE HFA 108 (90 BASE) MCG/ACT IN AERS: 2.0000 | INHALATION_SPRAY | RESPIRATORY_TRACT | 0 refills | Status: DC | PRN

## 2020-03-15 MED ORDER — AZITHROMYCIN 250 MG PO TABS: 250.0000 mg | ORAL_TABLET | Freq: Every day | ORAL | 0 refills | Status: DC

## 2020-03-15 NOTE — ED Triage Notes (Signed)
Pt presents to UC for chest congestion x2 days. Pt also complaining of fatigue. Pt denies fevers, chills, n/v/d, sore throat, runny nose, loss of taste or smell.   Pt requesting covid testing at this time.

## 2020-03-15 NOTE — ED Provider Notes (Signed)
Renaldo Fiddler    CSN: [id number] Arrival date & time: 03/15/20  1058      History   Chief Complaint Chief Complaint  Patient presents with  . Nasal Congestion    HPI Steve Jones is a 64 y.o. male.   Patient presents with fatigue and chest congestion x2 days.  He is a smoker and has a chronic cough.  He request a COVID test.  He is fully vaccinated.  He denies fever, chills, shortness of breath, abdominal pain, vomiting, diarrhea, rash, or other symptoms.  Treatment attempted at home by using his girlfriend's albuterol inhaler.  The history is provided by the patient.    Past Medical History:  Diagnosis Date  . Gout   . Smoker     Patient Active Problem List   Diagnosis Date Noted  . Shoulder pain 12/11/2018  . Ear lesion 09/17/2018  . HLD (hyperlipidemia) 09/17/2018  . 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 Encounter for general adult medical examination with abnormal findings 04/10/2017  . Advance care planning 04/10/2017  . Smoker   . Gout     History reviewed. No pertinent surgical history.     Home Medications    Prior to Admission medications   Medication Sig Start Date End Date Taking? Authorizing Provider  albuterol (VENTOLIN HFA) 108 (90 Base) MCG/ACT inhaler Inhale 2 puffs into the lungs every 4 (four) hours as needed for wheezing or shortness of breath. 03/15/20   Mickie Bail, NP  azithromycin (ZITHROMAX) 250 MG tablet Take 1 tablet (250 mg total) by mouth daily. Take first 2 tablets together, then 1 every day until finished. 03/15/20   Mickie Bail, NP  famotidine (PEPCID) 20 MG tablet Take 1 tablet (20 mg total) by mouth 2 (two) times daily. 09/15/18   Joaquim Nam, MD  ibuprofen (ADVIL,MOTRIN) 200 MG tablet Take 200 mg by mouth as needed.    [provider]  MITIGARE 0.6 MG CAPS TAKE ONE CAPSULE BY MOUTH TWICE DAILY ASNEEDED 11/20/19   Joaquim Nam, MD  predniSONE (DELTASONE) 10 MG tablet Take 4 tablets (40 mg total) by mouth daily  for 5 days. 03/15/20 03/20/20  Mickie Bail, NP    Family History Family History  Problem Relation Age of Onset  . Colon cancer Father   . Aneurysm Brother   . Prostate cancer Neg Hx     Social History Social History   Tobacco Use  . Smoking status: Current Every Day Smoker  . Smokeless tobacco: Former Engineer, water Use Topics  . Alcohol use: Yes  . Drug use: No     Allergies   Patient has no known allergies.   Review of Systems Review of Systems  Constitutional: Positive for fatigue. Negative for chills and fever.  HENT: Positive for congestion. Negative for ear pain and sore throat.   Eyes: Negative for pain and visual disturbance.  Respiratory: Positive for cough. Negative for shortness of breath.   Cardiovascular: Negative for chest pain and palpitations.  Gastrointestinal: Negative for abdominal pain, diarrhea and vomiting.  Genitourinary: Negative for dysuria and hematuria.  Musculoskeletal: Negative for arthralgias and back pain.  Skin: Negative for color change and rash.  Neurological: Negative for seizures and syncope.  All other systems reviewed and are negative.    Physical Exam Triage Vital Signs ED Triage Vitals  Enc Vitals Group     BP      Pulse      Resp  Temp      Temp src      SpO2      Weight      Height      Head Circumference      Peak Flow      Pain Score      Pain Loc      Pain Edu?      Excl. in GC?    No data found.  Updated Vital Signs BP (!) 168/89 (BP Location: Right Arm)   Pulse 92   Temp 98.3 F (36.8 C) (Oral)   Resp 16   SpO2 95%   Visual Acuity Right Eye Distance:   Left Eye Distance:   Bilateral Distance:    Right Eye Near:   Left Eye Near:    Bilateral Near:     Physical Exam Vitals and nursing note reviewed.  Constitutional:      General: He is not in acute distress.    Appearance: He is well-developed. He is not ill-appearing.  HENT:     Head: Normocephalic and atraumatic.     Right Ear:  Tympanic membrane normal.     Left Ear: Tympanic membrane normal.     Nose: Nose normal.     Mouth/Throat:     Mouth: Mucous membranes are moist.     Pharynx: Oropharynx is clear.  Eyes:     Conjunctiva/sclera: Conjunctivae normal.  Cardiovascular:     Rate and Rhythm: Normal rate and regular rhythm.     Heart sounds: No murmur heard.   Pulmonary:     Effort: Pulmonary effort is normal. No respiratory distress.     Breath sounds: Wheezing and rhonchi present.     Comments: Few scattered wheezes and rhonchi. Abdominal:     Palpations: Abdomen is soft.     Tenderness: There is no abdominal tenderness. There is no guarding or rebound.  Musculoskeletal:     Cervical back: Neck supple.     Right lower leg: No edema.     Left lower leg: No edema.  Skin:    General: Skin is warm and dry.     Findings: No rash.  Neurological:     General: No focal deficit present.     Mental Status: He is alert and oriented to person, place, and time.     Gait: Gait normal.  Psychiatric:        Mood and Affect: Mood normal.        Behavior: Behavior normal.      UC Treatments / Results  Labs (all labs ordered are listed, but only abnormal results are displayed) Labs Reviewed  NOVEL CORONAVIRUS, NAA    EKG   Radiology No results found.  Procedures Procedures (including critical care time)  Medications Ordered in UC Medications - No data to display  Initial Impression / Assessment and Plan / UC Course  I have reviewed the triage vital signs and the nursing notes.  Pertinent labs & imaging results that were available during my care of the patient were reviewed by me and considered in my medical decision making (see chart for details).   Acute bronchitis.  Elevated blood pressure reading.  PCR COVID pending.  Instructed patient to self quarantine until the test result is back.  Treating with albuterol inhaler, Zithromax, prednisone.  Instructed patient not to take his gout medication  while taking the Zithromax.  Discussed that he needs to call his PCP to schedule an appointment for the next 1 to  2 weeks for a recheck of his lungs and also to have his blood pressure rechecked.  Instructed patient to go to the ED if he has acute worsening symptoms.  Patient agrees to plan of care.      Final Clinical Impressions(s) / UC Diagnoses   Final diagnoses:  Elevated blood pressure reading  Acute bronchitis, unspecified organism     Discharge Instructions     Use the albuterol inhaler as directed.  Take the Zithromax and prednisone as directed.    Do not take your gout medication while you are taking the Zithromax.    Follow up with your primary care provider if your symptoms are not improving.    Your COVID test is pending.  You should self quarantine until the test result is back.    Go to the emergency department if you develop acute worsening symptoms.    Your blood pressure is elevated today at 168/89.  Please have this rechecked by your primary care provider in 2-4 weeks.         ED Prescriptions    Medication Sig Dispense Auth. Provider   azithromycin (ZITHROMAX) 250 MG tablet Take 1 tablet (250 mg total) by mouth daily. Take first 2 tablets together, then 1 every day until finished. 6 tablet Mickie Bail, NP   predniSONE (DELTASONE) 10 MG tablet Take 4 tablets (40 mg total) by mouth daily for 5 days. 20 tablet Mickie Bail, NP   albuterol (VENTOLIN HFA) 108 (90 Base) MCG/ACT inhaler Inhale 2 puffs into the lungs every 4 (four) hours as needed for wheezing or shortness of breath. 18 g Mickie Bail, NP     PDMP not reviewed this encounter.   Mickie Bail, NP 03/15/20 1141

## 2020-03-15 NOTE — Discharge Instructions (Addendum)
Use the albuterol inhaler as directed.  Take the Zithromax and prednisone as directed.    Do not take your gout medication while you are taking the Zithromax.    Follow up with your primary care provider if your symptoms are not improving.    Your COVID test is pending.  You should self quarantine until the test result is back.    Go to the emergency department if you develop acute worsening symptoms.    Your blood pressure is elevated today at 168/89.  Please have this rechecked by your primary care provider in 2-4 weeks.

## 2020-03-17 LAB — NOVEL CORONAVIRUS, NAA: SARS-CoV-2, NAA: NOT DETECTED

## 2020-03-17 LAB — SARS-COV-2, NAA 2 DAY TAT

## 2020-04-04 ENCOUNTER — Other Ambulatory Visit: Payer: Self-pay | Admitting: Family Medicine

## 2020-04-05 NOTE — Telephone Encounter (Signed)
Electronic refill request. Mitigare Last office visit:   02/09/2019 Last Filled:    60 capsule 1 11/20/2019

## 2020-04-06 NOTE — Telephone Encounter (Signed)
Sent. Needs CPE when possible.  Thanks.  

## 2020-06-21 ENCOUNTER — Ambulatory Visit: Payer: BC Managed Care – PPO | Admitting: Family Medicine

## 2020-06-21 ENCOUNTER — Encounter: Payer: Self-pay | Admitting: Family Medicine

## 2020-06-21 ENCOUNTER — Other Ambulatory Visit: Payer: Self-pay

## 2020-06-21 VITALS — BP 180/100 | HR 101 | Temp 98.0°F | Ht 73.0 in | Wt 231.4 lb

## 2020-06-21 DIAGNOSIS — M109 Gout, unspecified: Secondary | ICD-10-CM | POA: Diagnosis not present

## 2020-06-21 DIAGNOSIS — H9390 Unspecified disorder of ear, unspecified ear: Secondary | ICD-10-CM

## 2020-06-21 DIAGNOSIS — I10 Essential (primary) hypertension: Secondary | ICD-10-CM | POA: Diagnosis not present

## 2020-06-21 MED ORDER — AMLODIPINE BESYLATE 5 MG PO TABS: 5.0000 mg | ORAL_TABLET | Freq: Every day | ORAL | 3 refills | Status: DC

## 2020-06-21 MED ORDER — COLCHICINE 0.6 MG PO CAPS: ORAL_CAPSULE | ORAL | 2 refills | Status: DC

## 2020-06-21 NOTE — Progress Notes (Signed)
This visit occurred during the SARS-CoV-2 public health emergency.  Safety protocols were in place, including screening questions prior to the visit, additional usage of staff PPE, and extensive cleaning of exam room while observing appropriate contact time as indicated for disinfecting solutions.  BP elevation noted.  No BP meds.  No CP, SOB, BLE edema.  No HA.    History of gout. Prn use of mitigare.  Used about 1 time a week.  Etoh on Fridays, sometimes with sx after that.  No FCNAVD. Discussed that alcohol can be a trigger. Discussed various forms of colchicine versus Medicare versus Colcrys. No current gout flare.  He needs ENT follow-up set up. Discussed. He prefer to go to a different clinic. He has a known ear lesion that is likely going to need a biopsy and if at all possible he would like to get the biopsy done on the day of the evaluation.  Meds, vitals, and allergies reviewed.   ROS: Per HPI unless specifically indicated in ROS section   nad ncat Left pinna with irritated lesion noted. Neck supple, no LA rrr ctab Abdomen soft. Not tender Extremities well perfused without edema.

## 2020-06-21 NOTE — Patient Instructions (Addendum)
We'll call about the ENT appointment.   Go to the lab on the way out.   If you have mychart we'll likely use that to update you.    Start amlodipine and update me about your BP in about 1 week.  Cut back on salt in the meantime.  Take care.  Glad to see you.

## 2020-06-22 DIAGNOSIS — I10 Essential (primary) hypertension: Secondary | ICD-10-CM | POA: Insufficient documentation

## 2020-06-22 LAB — BASIC METABOLIC PANEL
BUN: 13 mg/dL (ref 6–23)
CO2: 28 mEq/L (ref 19–32)
Calcium: 8.8 mg/dL (ref 8.4–10.5)
Chloride: 104 mEq/L (ref 96–112)
Creatinine, Ser: 1.33 mg/dL (ref 0.40–1.50)
GFR: 56.65 mL/min — ABNORMAL LOW (ref >60.00)
Glucose, Bld: 75 mg/dL (ref 70–99)
Potassium: 4 mEq/L (ref 3.5–5.1)
Sodium: 139 mEq/L (ref 135–145)

## 2020-06-22 LAB — URIC ACID: Uric Acid, Serum: 8.5 mg/dL — ABNORMAL HIGH (ref 4.0–7.8)

## 2020-06-22 NOTE — Assessment & Plan Note (Signed)
Reasonable to check routine labs today. See notes on labs. Start amlodipine 5 mg. Routine cautions given to patient. I want him to update me about his blood pressure if it is not controlled. See after visit summary. Discussed decreasing salt.

## 2020-06-22 NOTE — Assessment & Plan Note (Signed)
Recheck labs today. Continue as needed colchicine. Okay to fill with colchicine/Medicare/Colcrys. Rationale discussed with patient. He agrees. He will update me as needed.

## 2020-06-22 NOTE — Assessment & Plan Note (Signed)
Refer to ENT. See above.

## 2020-07-21 LAB — SURGICAL PATHOLOGY

## 2020-10-28 ENCOUNTER — Other Ambulatory Visit: Payer: Self-pay

## 2020-10-28 MED ORDER — COLCHICINE 0.6 MG PO TABS: 0.6000 mg | ORAL_TABLET | Freq: Two times a day (BID) | ORAL | 2 refills | Status: DC

## 2020-11-22 ENCOUNTER — Telehealth: Payer: Self-pay

## 2020-11-22 NOTE — Telephone Encounter (Signed)
Steve Jones (DPR signed) said for about 1 wk pt had pain, redness and swelling in rt knee due to gout and now the pain and swelling has moved to rt foot and big toe. Camelia Eng is not sure if there is redness with the rt foot or toe but pt complaining of a lot of pain. Med given is not helping the gout and the med pt used to take for gout is not approved for pts ins and out of pocket would be $ 400.00.pt last seen 06/21/20. Steve Jones scheduled in office appt on 11/23/20 at 11:15 with Dr Alphonsus Sias. No covid symptoms.

## 2020-11-23 ENCOUNTER — Ambulatory Visit: Payer: BC Managed Care – PPO | Admitting: Internal Medicine

## 2020-11-23 NOTE — Telephone Encounter (Signed)
That sounds like the typical ridiculous colchicine issue---we should be able to get some brand of it reasonably. Will discuss at today's visit

## 2021-05-12 ENCOUNTER — Other Ambulatory Visit: Payer: Self-pay

## 2021-05-12 ENCOUNTER — Encounter: Payer: Self-pay | Admitting: Family Medicine

## 2021-05-12 ENCOUNTER — Ambulatory Visit: Payer: BC Managed Care – PPO | Admitting: Family Medicine

## 2021-05-12 VITALS — BP 124/78 | HR 81 | Temp 97.5°F | Ht 73.0 in | Wt 225.0 lb

## 2021-05-12 DIAGNOSIS — M109 Gout, unspecified: Secondary | ICD-10-CM | POA: Diagnosis not present

## 2021-05-12 DIAGNOSIS — R5383 Other fatigue: Secondary | ICD-10-CM

## 2021-05-12 DIAGNOSIS — Z23 Encounter for immunization: Secondary | ICD-10-CM

## 2021-05-12 DIAGNOSIS — I10 Essential (primary) hypertension: Secondary | ICD-10-CM | POA: Diagnosis not present

## 2021-05-12 MED ORDER — ALBUTEROL SULFATE HFA 108 (90 BASE) MCG/ACT IN AERS: 2.0000 | INHALATION_SPRAY | RESPIRATORY_TRACT | 5 refills | Status: DC | PRN

## 2021-05-12 MED ORDER — COLCHICINE 0.6 MG PO TABS: 0.6000 mg | ORAL_TABLET | Freq: Two times a day (BID) | ORAL | 5 refills | Status: DC

## 2021-05-12 MED ORDER — COLCHICINE 0.6 MG PO TABS: 0.6000 mg | ORAL_TABLET | Freq: Every day | ORAL | Status: DC

## 2021-05-12 MED ORDER — AMLODIPINE BESYLATE 5 MG PO TABS: 5.0000 mg | ORAL_TABLET | Freq: Every day | ORAL | 3 refills | Status: DC

## 2021-05-12 NOTE — Patient Instructions (Addendum)
See about getting your labs done in the AM when fasting.   Take care.  Glad to see you. You can cut the colchicine back to every other day.  Take twice a day if having a flare.

## 2021-05-12 NOTE — Progress Notes (Signed)
This visit occurred during the SARS-CoV-2 public health emergency.  Safety protocols were in place, including screening questions prior to the visit, additional usage of staff PPE, and extensive cleaning of exam room while observing appropriate contact time as indicated for disinfecting solutions.  He quit drinking and smoking.  D/w pt.  He has fatigue.  He is married and doing well, home situation is good.  No FCNAV.  He doesn't feel unwell but fatigue is noted and libido is low.  No blood in urine or stool.  No abd pain.  No rash.    I asked him to update me when he wanted to get a GI referral, d/w pt.    Meds, vitals, and allergies reviewed.   ROS: Per HPI unless specifically indicated in ROS section   GEN: nad, alert and oriented HEENT: ncat NECK: supple w/o LA CV: rrr.   PULM: ctab, no inc wob ABD: soft, +bs EXT: no edema SKIN: no acute rash Likely chronic gouty changes at L olecranon and R knee.   No acute changes.

## 2021-05-14 DIAGNOSIS — R5383 Other fatigue: Secondary | ICD-10-CM | POA: Insufficient documentation

## 2021-05-14 NOTE — Assessment & Plan Note (Signed)
Letter done for patient for him get follow-up labs.  He can cut colchicine back in the meantime.  See after visit summary.  CMET, CBC, TSH, lipid- I10.   B12 level- R53.83 Testosterone- R 53.83 PSA - Z12.5  Uric acid- M10.9

## 2021-05-14 NOTE — Assessment & Plan Note (Signed)
CMET, CBC, TSH, lipid- I10.   B12 level- R53.83 Testosterone- R 53.83 PSA - Z12.5  Uric acid- M10.9  See letter about checking labs.  No change in amlodipine at this point.

## 2021-05-14 NOTE — Assessment & Plan Note (Signed)
CMET, CBC, TSH, lipid- I10.   B12 level- R53.83 Testosterone- R 53.83 PSA - Z12.5  Uric acid- M10.9  See orders above.  We will check basic labs and go from there.  Benign exam.

## 2021-05-15 ENCOUNTER — Other Ambulatory Visit: Payer: Self-pay | Admitting: Family Medicine

## 2021-05-16 LAB — COMPREHENSIVE METABOLIC PANEL
ALT: 66 IU/L — ABNORMAL HIGH (ref 0–44)
AST: 45 IU/L — ABNORMAL HIGH (ref 0–40)
Albumin/Globulin Ratio: 1.7 (ref 1.2–2.2)
Albumin: 4.3 g/dL (ref 3.8–4.8)
Alkaline Phosphatase: 89 IU/L (ref 44–121)
BUN/Creatinine Ratio: 8 — ABNORMAL LOW (ref 10–24)
BUN: 17 mg/dL (ref 8–27)
Bilirubin Total: 0.3 mg/dL (ref 0.0–1.2)
CO2: 19 mmol/L — ABNORMAL LOW (ref 20–29)
Calcium: 9.3 mg/dL (ref 8.6–10.2)
Chloride: 102 mmol/L (ref 96–106)
Creatinine, Ser: 2.04 mg/dL — ABNORMAL HIGH (ref 0.76–1.27)
Globulin, Total: 2.6 g/dL (ref 1.5–4.5)
Glucose: 116 mg/dL — ABNORMAL HIGH (ref 70–99)
Potassium: 4.8 mmol/L (ref 3.5–5.2)
Sodium: 138 mmol/L (ref 134–144)
Total Protein: 6.9 g/dL (ref 6.0–8.5)
eGFR: 36 mL/min/{1.73_m2} — ABNORMAL LOW (ref >59)

## 2021-05-16 LAB — CBC WITH DIFFERENTIAL/PLATELET
Basophils Absolute: 0.1 10*3/uL (ref 0.0–0.2)
Basos: 1 %
EOS (ABSOLUTE): 0.2 10*3/uL (ref 0.0–0.4)
Eos: 2 %
Hematocrit: 46.9 % (ref 37.5–51.0)
Hemoglobin: 16.5 g/dL (ref 13.0–17.7)
Immature Grans (Abs): 0.1 10*3/uL (ref 0.0–0.1)
Immature Granulocytes: 1 %
Lymphocytes Absolute: 2.4 10*3/uL (ref 0.7–3.1)
Lymphs: 24 %
MCH: 32 pg (ref 26.6–33.0)
MCHC: 35.2 g/dL (ref 31.5–35.7)
MCV: 91 fL (ref 79–97)
Monocytes Absolute: 1 10*3/uL — ABNORMAL HIGH (ref 0.1–0.9)
Monocytes: 11 %
Neutrophils Absolute: 6 10*3/uL (ref 1.4–7.0)
Neutrophils: 61 %
Platelets: 331 10*3/uL (ref 150–450)
RBC: 5.16 x10E6/uL (ref 4.14–5.80)
RDW: 12.8 % (ref 11.6–15.4)
WBC: 9.8 10*3/uL (ref 3.4–10.8)

## 2021-05-16 LAB — TSH: TSH: 2.43 u[IU]/mL (ref 0.450–4.500)

## 2021-05-16 LAB — VITAMIN B12: Vitamin B-12: 328 pg/mL (ref 232–1245)

## 2021-05-16 LAB — URIC ACID: Uric Acid: 7.5 mg/dL (ref 3.8–8.4)

## 2021-05-16 LAB — LIPID PANEL W/O CHOL/HDL RATIO
Cholesterol, Total: 208 mg/dL — ABNORMAL HIGH (ref 100–199)
HDL: 51 mg/dL (ref >39)
LDL Chol Calc (NIH): 125 mg/dL — ABNORMAL HIGH (ref 0–99)
Triglycerides: 179 mg/dL — ABNORMAL HIGH (ref 0–149)
VLDL Cholesterol Cal: 32 mg/dL (ref 5–40)

## 2021-05-16 LAB — PSA: Prostate Specific Ag, Serum: 0.7 ng/mL (ref 0.0–4.0)

## 2021-05-16 LAB — SPECIMEN STATUS REPORT

## 2021-05-16 LAB — TESTOSTERONE: Testosterone: 807 ng/dL (ref 264–916)

## 2021-05-18 ENCOUNTER — Other Ambulatory Visit: Payer: Self-pay | Admitting: Family Medicine

## 2021-05-18 DIAGNOSIS — R7989 Other specified abnormal findings of blood chemistry: Secondary | ICD-10-CM

## 2021-05-18 MED ORDER — COLCHICINE 0.6 MG PO TABS: 0.6000 mg | ORAL_TABLET | Freq: Every day | ORAL | 0 refills | Status: DC

## 2021-05-18 NOTE — Addendum Note (Signed)
Addended by: Wendie Simmer B on: 05/18/2021 12:47 PM   Modules accepted: Orders

## 2021-05-18 NOTE — Addendum Note (Signed)
Addended by: Shon Millet on: 05/18/2021 12:51 PM   Modules accepted: Orders

## 2021-05-19 LAB — COMPREHENSIVE METABOLIC PANEL
ALT: 71 IU/L — ABNORMAL HIGH (ref 0–44)
AST: 50 IU/L — ABNORMAL HIGH (ref 0–40)
Albumin/Globulin Ratio: 1.4 (ref 1.2–2.2)
Albumin: 4.2 g/dL (ref 3.8–4.8)
Alkaline Phosphatase: 89 IU/L (ref 44–121)
BUN/Creatinine Ratio: 10 (ref 10–24)
BUN: 14 mg/dL (ref 8–27)
Bilirubin Total: 0.3 mg/dL (ref 0.0–1.2)
CO2: 22 mmol/L (ref 20–29)
Calcium: 9.5 mg/dL (ref 8.6–10.2)
Chloride: 101 mmol/L (ref 96–106)
Creatinine, Ser: 1.34 mg/dL — ABNORMAL HIGH (ref 0.76–1.27)
Globulin, Total: 3 g/dL (ref 1.5–4.5)
Glucose: 96 mg/dL (ref 70–99)
Potassium: 4.6 mmol/L (ref 3.5–5.2)
Sodium: 140 mmol/L (ref 134–144)
Total Protein: 7.2 g/dL (ref 6.0–8.5)
eGFR: 59 mL/min/{1.73_m2} — ABNORMAL LOW (ref >59)

## 2021-05-19 LAB — HAV, HBV, HCV
HCV Ab: <0.1 s/co ratio (ref 0.0–0.9)
Hep B Core Total Ab: NEGATIVE
Hep B Surface Ab, Qual: NONREACTIVE
Hepatitis B Surface Ag: NEGATIVE
hep A Total Ab: NEGATIVE

## 2021-05-19 LAB — HCV INTERPRETATION

## 2021-05-29 ENCOUNTER — Telehealth: Payer: Self-pay | Admitting: Family Medicine

## 2021-05-29 NOTE — Telephone Encounter (Signed)
Pt wife called stating that pt has an appointment with kidney and liver ultra sound in [facility] [place] Oct 28, pt is retiring on Oct 31. Pt wife would like to know how  that  would work with the insurance. Pt wife would like for you to give her a call back.

## 2021-05-30 NOTE — Telephone Encounter (Signed)
Spoke to the pt he is aware that the his insurance will cover the [place] he is just concerned about this out of pocket cost. He has a flex card that has money on it but it will be inactive after 06/05/21. I advised pt to reach out to the billing dept to see if he can pay his out of pocket cost at the time of service. He said that if can't do that then he may reschedule this until after he gets on his wife insurance plan. Pt states that he is going to talk it over with his wife.

## 2021-05-30 NOTE — Telephone Encounter (Signed)
Pt wife called checking on status of the ultra sound and the insurance.

## 2021-06-02 ENCOUNTER — Ambulatory Visit: Payer: BC Managed Care – PPO

## 2021-11-06 ENCOUNTER — Telehealth: Payer: Self-pay | Admitting: Family Medicine

## 2021-11-06 MED ORDER — COLCHICINE 0.6 MG PO TABS: 0.6000 mg | ORAL_TABLET | Freq: Every day | ORAL | 3 refills | Status: DC

## 2021-11-06 NOTE — Telephone Encounter (Signed)
colchicine 0.6 MG tablet ? ?Please send to [facility] order pharmacy ? ?Pt paid out of pocket today for medication since he was out ?

## 2021-11-06 NOTE — Telephone Encounter (Signed)
Called and left message on VM that [facility] was [facility] as requested ?

## 2021-12-06 ENCOUNTER — Telehealth: Payer: Self-pay | Admitting: Family Medicine

## 2021-12-15 ENCOUNTER — Ambulatory Visit (INDEPENDENT_AMBULATORY_CARE_PROVIDER_SITE_OTHER): Payer: 59 | Admitting: Family Medicine

## 2021-12-15 ENCOUNTER — Encounter: Payer: Self-pay | Admitting: Family Medicine

## 2021-12-15 VITALS — BP 122/66 | HR 61 | Temp 97.9°F | Ht 73.0 in | Wt 216.0 lb

## 2021-12-15 DIAGNOSIS — Z23 Encounter for immunization: Secondary | ICD-10-CM

## 2021-12-15 DIAGNOSIS — Z0000 Encounter for general adult medical examination without abnormal findings: Secondary | ICD-10-CM | POA: Diagnosis not present

## 2021-12-15 DIAGNOSIS — Z7189 Other specified counseling: Secondary | ICD-10-CM

## 2021-12-15 DIAGNOSIS — N529 Male erectile dysfunction, unspecified: Secondary | ICD-10-CM

## 2021-12-15 DIAGNOSIS — R5383 Other fatigue: Secondary | ICD-10-CM

## 2021-12-15 DIAGNOSIS — M109 Gout, unspecified: Secondary | ICD-10-CM

## 2021-12-15 DIAGNOSIS — I10 Essential (primary) hypertension: Secondary | ICD-10-CM

## 2021-12-15 MED ORDER — COLCHICINE 0.6 MG PO TABS: 0.6000 mg | ORAL_TABLET | ORAL | 3 refills | Status: DC

## 2021-12-15 MED ORDER — AMLODIPINE BESYLATE 5 MG PO TABS: 5.0000 mg | ORAL_TABLET | Freq: Every day | ORAL | 3 refills | Status: DC

## 2021-12-15 MED ORDER — SILDENAFIL CITRATE 20 MG PO TABS: 20.0000 mg | ORAL_TABLET | Freq: Every day | ORAL | 3 refills | Status: DC | PRN

## 2021-12-15 MED ORDER — ALBUTEROL SULFATE HFA 108 (90 BASE) MCG/ACT IN AERS: 2.0000 | INHALATION_SPRAY | RESPIRATORY_TRACT | 5 refills | Status: DC | PRN

## 2021-12-15 NOTE — Patient Instructions (Addendum)
Please have your labs sent to me.   ?Use sildenafil if needed.  ?Let me know if you need helping getting set up for a colonoscopy.   ?Take care.  Glad to see you. ?Aortic aneurysm screening- let me know if you want me to set that up.   ?

## 2021-12-15 NOTE — Progress Notes (Signed)
Gout.  No recent flares.  Labs pending.  Taking colchicine every other day.  ? ?Hypertension:    ?Using medication without problems or lightheadedness:  yes ?Chest pain with exertion:no ?Edema:no ?Short of breath:no ?Walking a lot at work.   ?Some fatigue noted.   ? ?D/w pt about SABA use.  Rare use with pollen exposure.  Discussed with patient that he can continue rare/prn use and update me as needed. ? ?ED.  D/w pt about sildenafil use.  No NTG.  Routine cautions d/w pt.   ? ?Tetanus d/w pt.  He can check on coverage.  Encouraged. ?Flu usually done at work. ?Pneumonia vaccine 2023 ?Shingles vaccination discussed with patient.  ?Colon cancer screening d/w pt. Colonoscopy encouraged.  FH noted.   ?Prostate cancer screening and PSA options (with potential risks and benefits of testing vs not testing) were discussed along with recent recs/guidelines.  He declined testing PSA at this point. ?Diet and exercise discussed with patient. Encouraged healthy diet. Encouraged routine exercise. ?Living will discussed with patient. Girlfriend Theressa Stamps designated if patient were incapacitated. ?AAA screening d/w pt.  ? ?Meds, vitals, and allergies reviewed.  ? ?PMH and SH reviewed ? ?ROS: Per HPI unless specifically indicated in ROS section  ? ?GEN: nad, alert and oriented ?HEENT: ncat ?NECK: supple w/o LA ?CV: rrr. ?PULM: ctab, no inc wob ?ABD: soft, +bs ?EXT: no edema ?SKIN: no acute rash ?

## 2021-12-17 DIAGNOSIS — N529 Male erectile dysfunction, unspecified: Secondary | ICD-10-CM | POA: Insufficient documentation

## 2021-12-17 NOTE — Assessment & Plan Note (Signed)
Tetanus d/w pt.  He can check on coverage.  Encouraged. ?Flu usually done at work. ?Pneumonia vaccine 2023 ?Shingles vaccination discussed with patient.  ?Colon cancer screening d/w pt. Colonoscopy encouraged.  FH noted.   ?Prostate cancer screening and PSA options (with potential risks and benefits of testing vs not testing) were discussed along with recent recs/guidelines.  He declined testing PSA at this point. ?Diet and exercise discussed with patient. Encouraged healthy diet. Encouraged routine exercise. ?Living will discussed with patient. Girlfriend Cinda Quest designated if patient were incapacitated. ?AAA screening d/w pt.  ?

## 2021-12-17 NOTE — Assessment & Plan Note (Signed)
Taking colchicine every other day.  Continue as is for now.  See notes on labs. ?

## 2021-12-17 NOTE — Assessment & Plan Note (Signed)
Living will discussed with patient. Girlfriend Theressa Stamps designated if patient were incapacitated. ?

## 2021-12-17 NOTE — Assessment & Plan Note (Signed)
Continue amlodipine.  See notes on labs.  Continue work on diet and exercise. 

## 2021-12-17 NOTE — Assessment & Plan Note (Signed)
D/w pt about sildenafil use.  No NTG.  Routine cautions d/w pt.   ?

## 2021-12-18 ENCOUNTER — Telehealth: Payer: Self-pay | Admitting: Family Medicine

## 2021-12-18 NOTE — Telephone Encounter (Signed)
[facility] needs a prior authori ?

## 2021-12-18 NOTE — Telephone Encounter (Signed)
Patient needs a prior authorization sent to [facility] for the [facility] medication. ?Patient's insurance will not cover the nasal spray that was sent in.  Can this be changed.  [facility] send to [facility] ?

## 2021-12-19 NOTE — Telephone Encounter (Signed)
The prior authorization will be denied for sildenafil.  Let patient know the best option is to pay cash. ? ?Is the other issue about a nasal spray or about albuterol?  If it is about albuterol then please let me know what else is covered.  Thanks. ?

## 2021-12-20 MED ORDER — ALBUTEROL SULFATE HFA 108 (90 BASE) MCG/ACT IN AERS: 2.0000 | INHALATION_SPRAY | RESPIRATORY_TRACT | 5 refills | Status: DC | PRN

## 2021-12-20 NOTE — Telephone Encounter (Signed)
Spoke with patients wife and advised about the sildenafil rx; can get thru [facility] with their pharmacy for $31. Camelia Eng also stated it was the albuterol that was not covered not the nasal spray.  ?

## 2021-12-20 NOTE — Telephone Encounter (Signed)
Called patient but he did not know what we were talking about; states his wife handles all of this. He will have her call us back. ?

## 2021-12-20 NOTE — Telephone Encounter (Signed)
I resent the albuterol rx to fill with albuterol/proair/ventolin.  That should help.  If not, then let me know.   ?

## 2021-12-20 NOTE — Addendum Note (Signed)
Addended by: Tonia Ghent on: 12/20/2021 12:55 PM ? ? Modules accepted: Orders ? ?

## 2022-05-16 ENCOUNTER — Ambulatory Visit: Payer: Self-pay

## 2022-05-16 ENCOUNTER — Ambulatory Visit (LOCAL_COMMUNITY_HEALTH_CENTER): Payer: 59

## 2022-05-16 DIAGNOSIS — Z719 Counseling, unspecified: Secondary | ICD-10-CM

## 2022-05-16 DIAGNOSIS — Z23 Encounter for immunization: Secondary | ICD-10-CM

## 2022-05-16 NOTE — Progress Notes (Signed)
  Are you feeling sick today? No   Have you ever received a dose of COVID-19 Vaccine? [employer], [place], [place], [place], Other) Yes  If yes, which vaccine and how many doses?   PFIZER, 4   Did you bring the vaccination record card or other documentation?  Yes   Do you have a health condition or are undergoing treatment that makes you moderately or severely immunocompromised? This would include, but not be limited to: cancer, HIV, organ transplant, immunosuppressive therapy/high-dose corticosteroids, or moderate/severe primary immunodeficiency.  No  Have you received COVID-19 vaccine before or during hematopoietic cell transplant (HCT) or CAR-T-cell therapies? No  Have you ever had an allergic reaction to: (This would include a severe allergic reaction or a reaction that caused hives, swelling, or respiratory distress, including wheezing.) A component of a COVID-19 vaccine or a previous dose of COVID-19 vaccine? No   Have you ever had an allergic reaction to another vaccine (other thanCOVID-19 vaccine) or an injectable medication? (This would include a severe allergic reaction or a reaction that caused hives, swelling, or respiratory distress, including wheezing.)   No    Do you have a history of any of the following:  Myocarditis or Pericarditis No  Dermal fillers:  No  Multisystem Inflammatory Syndrome (MIS-C or MIS-A)? No  COVID-19 disease within the past 3 months? No  Vaccinated with monkeypox vaccine in the last 4 weeks? No  Administered Pfizer Comirnaty 12+, 23-24 and high dose fluzone, tolerated well. Verbalized understanding of VIS and NCIR copy. M.Metha Kolasa, LPN.

## 2022-05-21 ENCOUNTER — Other Ambulatory Visit: Payer: Self-pay | Admitting: Family Medicine

## 2022-06-01 ENCOUNTER — Ambulatory Visit: Payer: 59 | Admitting: Family Medicine

## 2022-06-01 ENCOUNTER — Encounter: Payer: Self-pay | Admitting: Family Medicine

## 2022-06-01 VITALS — BP 120/82 | HR 81 | Temp 98.0°F | Ht 73.0 in | Wt 220.0 lb

## 2022-06-01 DIAGNOSIS — E785 Hyperlipidemia, unspecified: Secondary | ICD-10-CM | POA: Diagnosis not present

## 2022-06-01 DIAGNOSIS — M109 Gout, unspecified: Secondary | ICD-10-CM

## 2022-06-01 NOTE — Patient Instructions (Addendum)
Get the labs done at [facility].   Take care.  Glad to see you. Let me see about trying allopurinol after I get your labs back.    Check with your insurance to see if they will cover the shingles shot.  I would wait 1 month between covid and shingles shots.

## 2022-06-01 NOTE — Progress Notes (Unsigned)
Gout.  Usually taking colchicine QOD.  He had some seafood and beer and then had a flare this week, 05/28/22. R knee and R ankle.  His insurance wouldn't prev cover allopurinol.  No ADE on med.  Last gout flare was months ago.    Shingles d/w pt.    Meds, vitals, and allergies reviewed.   ROS: Per HPI unless specifically indicated in ROS section   Chronic joint changes at baseline, B knees and elbows.         After getting labs back, see about trying allopurinol.

## 2022-06-03 NOTE — Assessment & Plan Note (Signed)
He will get labs drawn at [facility]  After getting labs resulted, we can see about potential restart of allopurinol.  Avoid trigger foods for now.  Continue colchicine as is.  Update me as needed.  He agrees with plan.

## 2022-09-03 ENCOUNTER — Telehealth: Payer: Self-pay | Admitting: Family Medicine

## 2022-09-03 ENCOUNTER — Other Ambulatory Visit: Payer: Self-pay | Admitting: Family Medicine

## 2022-09-03 NOTE — Telephone Encounter (Signed)
Rx has been refilled.  

## 2022-09-03 NOTE — Telephone Encounter (Signed)
Prescription Request  09/03/2022  Is this a "Controlled Substance" medicine? No  LOV: 06/01/2022  What is the name of the medication or equipment?  colchicine 0.6 MG tablet  Have you contacted your pha  Which pharmacy would you like this sent to?   [facility] [place] AT [address] [place] [address] Phone: [phone] Fax: [phone]    Patient notified that their request is being sent to the clinical staff for review and that they should receive a response within 2 business days.   Please advise at Mobile [phone] (mobile)

## 2022-09-10 ENCOUNTER — Telehealth: Payer: Self-pay | Admitting: Family Medicine

## 2022-09-10 NOTE — Telephone Encounter (Signed)
Patient called in stating that medicationcolchicine 0.6 MG tablet  is showing that it  will not be filled until  09/23/2022 [facility] Patient only have 5 pills let. Would like to know if an early refill can be sent in to  [facility], [place] Phone: [phone]  Fax: [phone]

## 2022-09-11 NOTE — Telephone Encounter (Signed)
Called and spoke with patient about his medication. Refill was sent in on 09/03/22 with 45 tablets and refills. Patient states he is using them more then prescribed. States he takes them every other day but then takes them everyday when he gets a gout flare. Insurance will not pay for rx until 09/23/22 when the next fill is due. Patient would like the rx to be rewritten to accommodate his usage.

## 2022-09-12 MED ORDER — COLCHICINE 0.6 MG PO TABS: 0.6000 mg | ORAL_TABLET | Freq: Every day | ORAL | 0 refills | Status: DC | PRN

## 2022-09-12 NOTE — Addendum Note (Signed)
Addended by: Tonia Ghent on: 09/12/2022 11:07 AM   Modules accepted: Orders

## 2022-09-12 NOTE — Telephone Encounter (Signed)
Spoke with patient and advised rx was sent in. Also asked patient to get results from labs that were done in oct and have sent over to [place]. Patient stated he would.

## 2022-09-12 NOTE — Telephone Encounter (Signed)
Sent.  Please ask patient to get labs collected and results sent to me.  Orders are in EMR.  Thanks.

## 2022-09-13 NOTE — Telephone Encounter (Signed)
I don't recall seeing the lab results from fall 2023.  Did he have them collected?  If so, please see about getting a copy.   If not done yet, I would use those orders.  Thanks.

## 2022-09-13 NOTE — Telephone Encounter (Signed)
Patient would like to know if Dr Damita Dunnings need the labs from October for a certain reason ,or can he have new labs drawn and sent over? He stated that his wife works at [employer] and he can get them drawn again for free?

## 2022-09-14 ENCOUNTER — Encounter: Payer: Self-pay | Admitting: Family Medicine

## 2022-09-14 NOTE — Telephone Encounter (Signed)
New orders have been given to patient to have labs done while wife was in office for OV with Dr. Damita Dunnings today.

## 2022-09-19 ENCOUNTER — Other Ambulatory Visit: Payer: Self-pay | Admitting: Family Medicine

## 2022-09-19 LAB — COMPREHENSIVE METABOLIC PANEL
ALT: 61 IU/L — ABNORMAL HIGH (ref 0–44)
AST: 56 IU/L — ABNORMAL HIGH (ref 0–40)
Albumin/Globulin Ratio: 1.3 (ref 1.2–2.2)
Albumin: 4.2 g/dL (ref 3.9–4.9)
Alkaline Phosphatase: 88 IU/L (ref 44–121)
BUN/Creatinine Ratio: 11 (ref 10–24)
BUN: 14 mg/dL (ref 8–27)
Bilirubin Total: 0.5 mg/dL (ref 0.0–1.2)
CO2: 22 mmol/L (ref 20–29)
Calcium: 9.6 mg/dL (ref 8.6–10.2)
Chloride: 100 mmol/L (ref 96–106)
Creatinine, Ser: 1.26 mg/dL (ref 0.76–1.27)
Globulin, Total: 3.2 g/dL (ref 1.5–4.5)
Glucose: 88 mg/dL (ref 70–99)
Potassium: 4.9 mmol/L (ref 3.5–5.2)
Sodium: 141 mmol/L (ref 134–144)
Total Protein: 7.4 g/dL (ref 6.0–8.5)
eGFR: 63 mL/min/{1.73_m2} (ref >59)

## 2022-09-19 LAB — LIPID PANEL
Chol/HDL Ratio: 5 ratio (ref 0.0–5.0)
Cholesterol, Total: 233 mg/dL — ABNORMAL HIGH (ref 100–199)
HDL: 47 mg/dL (ref >39)
LDL Chol Calc (NIH): 155 mg/dL — ABNORMAL HIGH (ref 0–99)
Triglycerides: 172 mg/dL — ABNORMAL HIGH (ref 0–149)
VLDL Cholesterol Cal: 31 mg/dL (ref 5–40)

## 2022-09-19 LAB — CBC WITH DIFFERENTIAL/PLATELET
Basophils Absolute: 0.1 10*3/uL (ref 0.0–0.2)
Basos: 1 %
EOS (ABSOLUTE): 0.3 10*3/uL (ref 0.0–0.4)
Eos: 3 %
Hematocrit: 48.8 % (ref 37.5–51.0)
Hemoglobin: 17 g/dL (ref 13.0–17.7)
Immature Grans (Abs): 0 10*3/uL (ref 0.0–0.1)
Immature Granulocytes: 0 %
Lymphocytes Absolute: 2.8 10*3/uL (ref 0.7–3.1)
Lymphs: 30 %
MCH: 31.8 pg (ref 26.6–33.0)
MCHC: 34.8 g/dL (ref 31.5–35.7)
MCV: 91 fL (ref 79–97)
Monocytes Absolute: 0.9 10*3/uL (ref 0.1–0.9)
Monocytes: 10 %
Neutrophils Absolute: 5.3 10*3/uL (ref 1.4–7.0)
Neutrophils: 56 %
Platelets: 298 10*3/uL (ref 150–450)
RBC: 5.34 x10E6/uL (ref 4.14–5.80)
RDW: 13.6 % (ref 11.6–15.4)
WBC: 9.5 10*3/uL (ref 3.4–10.8)

## 2022-09-19 LAB — URIC ACID: Uric Acid: 8.6 mg/dL — ABNORMAL HIGH (ref 3.8–8.4)

## 2022-09-19 MED ORDER — ALLOPURINOL 100 MG PO TABS: 50.0000 mg | ORAL_TABLET | Freq: Every day | ORAL | 1 refills | Status: DC

## 2022-09-20 ENCOUNTER — Other Ambulatory Visit: Payer: Self-pay | Admitting: Family Medicine

## 2022-09-20 DIAGNOSIS — M109 Gout, unspecified: Secondary | ICD-10-CM

## 2022-09-20 NOTE — Addendum Note (Signed)
Addended by: Sherrilee Gilles B on: 09/20/2022 03:52 PM   Modules accepted: Orders

## 2022-09-21 ENCOUNTER — Telehealth: Payer: Self-pay | Admitting: Family Medicine

## 2022-09-21 NOTE — Telephone Encounter (Signed)
Patients friend, Steve Jones called in requesting the order for liver US as discussed in lab result to be sent to [facility].   Called and left voicemail with her, per dpr advising the [place] order was placed for  radiology and gave phone number for them to call and schedule. Advised to call with any further questions.

## 2022-10-05 ENCOUNTER — Ambulatory Visit
Admission: RE | Admit: 2022-10-05 | Discharge: 2022-10-05 | Disposition: A | Discharge status: Home / Self Care | Payer: 59 | Source: Ambulatory Visit | Attending: Family Medicine | Admitting: Family Medicine

## 2022-10-05 DIAGNOSIS — R7989 Other specified abnormal findings of blood chemistry: Secondary | ICD-10-CM | POA: Diagnosis present

## 2022-10-09 ENCOUNTER — Telehealth: Payer: Self-pay | Admitting: Family Medicine

## 2022-10-09 NOTE — Telephone Encounter (Signed)
Pt called asking for results of scan done on 10/05/22? Call back # [phone]

## 2022-10-09 NOTE — Telephone Encounter (Signed)
See result note for further documentation.

## 2022-10-10 ENCOUNTER — Telehealth: Payer: Self-pay | Admitting: Family Medicine

## 2022-10-10 ENCOUNTER — Other Ambulatory Visit: Payer: Self-pay | Admitting: Family Medicine

## 2022-10-10 MED ORDER — ATORVASTATIN CALCIUM 10 MG PO TABS: 10.0000 mg | ORAL_TABLET | Freq: Every day | ORAL | 1 refills | Status: DC

## 2022-10-10 NOTE — Telephone Encounter (Signed)
Patient called in regarding the status of medication atorvastatin being sent in.

## 2022-10-11 ENCOUNTER — Other Ambulatory Visit: Payer: Self-pay

## 2022-10-11 DIAGNOSIS — R7989 Other specified abnormal findings of blood chemistry: Secondary | ICD-10-CM

## 2022-10-11 DIAGNOSIS — E785 Hyperlipidemia, unspecified: Secondary | ICD-10-CM

## 2022-10-11 NOTE — Telephone Encounter (Signed)
Rx has been sent in and patient is aware

## 2022-10-11 NOTE — Addendum Note (Signed)
Addended by: Pat Kocher on: 10/11/2022 09:30 AM   Modules accepted: Orders

## 2022-10-11 NOTE — Addendum Note (Signed)
Addended by: Pat Kocher on: 10/11/2022 09:31 AM   Modules accepted: Orders

## 2023-01-22 ENCOUNTER — Ambulatory Visit: Payer: 59 | Admitting: Family Medicine

## 2023-01-22 ENCOUNTER — Encounter: Payer: Self-pay | Admitting: Family Medicine

## 2023-01-22 VITALS — BP 122/78 | HR 80 | Temp 98.9°F | Ht 73.0 in | Wt 231.0 lb

## 2023-01-22 DIAGNOSIS — S0101XA Laceration without foreign body of scalp, initial encounter: Secondary | ICD-10-CM | POA: Diagnosis not present

## 2023-01-22 DIAGNOSIS — M109 Gout, unspecified: Secondary | ICD-10-CM | POA: Diagnosis not present

## 2023-01-22 DIAGNOSIS — E785 Hyperlipidemia, unspecified: Secondary | ICD-10-CM | POA: Diagnosis not present

## 2023-01-22 NOTE — Progress Notes (Unsigned)
Elevated Cholesterol: Using medications without problems: yes Muscle aches: no Diet compliance: d/w pt.  Exercise: d/w pt.   D/w pt about fatty liver on u/s.   No recent gout flares.  Compliant with allopurinol.  D/w pt about getting f/u labs.  Ordered.  Was drinking coffee and got choked.  No dysphagia with solids.  Then hit his head.  No LOC.  Cleaned his scalp abrasion at the time.  No HA today.  No bleeding today.  No double vision.  No photophobia.  Normal speech.  Normal concentration.    Meds, vitals, and allergies reviewed.  ROS: Per HPI unless specifically indicated in ROS section   GEN: nad, alert and oriented HEENT: ncat except for 3cm closed linear laceration, superficial, on the scalp.  No bruising.   NECK: supple w/o LA CV: rrr. PULM: ctab, no inc wob ABD: soft, +bs EXT: no edema SKIN: no acute rash

## 2023-01-22 NOTE — Patient Instructions (Addendum)
I put in the orders for CMET/lipid/uric acid.   Fasting labs when possible.   Take care.  Glad to see you.  You should be fine but keep it clean and don't get sunburned.   Update me as needed.

## 2023-01-24 DIAGNOSIS — S0101XA Laceration without foreign body of scalp, initial encounter: Secondary | ICD-10-CM | POA: Insufficient documentation

## 2023-01-24 NOTE — Assessment & Plan Note (Addendum)
Clean appearing.  He can keep it covered.  No need to suture.  This should heal normally.  Fortunately he does not have concussive symptoms.

## 2023-01-24 NOTE — Assessment & Plan Note (Signed)
Continue allopurinol and he can get follow-up labs done.

## 2023-01-24 NOTE — Assessment & Plan Note (Signed)
I put in the orders for CMET/lipid/uric acid.   Fasting labs when possible.  Continue atorvastatin for now.

## 2023-02-06 ENCOUNTER — Other Ambulatory Visit: Payer: Self-pay | Admitting: Family Medicine

## 2023-04-25 ENCOUNTER — Other Ambulatory Visit: Payer: Self-pay | Admitting: Family Medicine

## 2023-05-21 ENCOUNTER — Other Ambulatory Visit: Payer: Self-pay | Admitting: Family Medicine

## 2023-05-22 NOTE — Telephone Encounter (Signed)
Yes, sent.  Thanks.

## 2023-05-22 NOTE — Telephone Encounter (Signed)
Last note you wanted patient to come in for fasting labs has not had. I have called but voicemail is full. Ok to refill as pended with  note to do labs on script?

## 2023-07-01 ENCOUNTER — Other Ambulatory Visit: Payer: Self-pay | Admitting: Family Medicine

## 2023-07-02 ENCOUNTER — Telehealth: Payer: Self-pay | Admitting: Family Medicine

## 2023-07-02 NOTE — Telephone Encounter (Signed)
Patient's wife called to f/u on this refill request. States patient has a few left but needs a refill, please advise

## 2023-07-02 NOTE — Telephone Encounter (Signed)
Called left vm with patient wife Camelia Eng just to clarify whether or not they are going to pick up the lipitor from the pharmacy and want future rx's to go to [facility].

## 2023-07-02 NOTE — Telephone Encounter (Signed)
Patient's wife is also requesting a refill of medication atorvastatin (LIPITOR) 10 MG tablet , states the pharmacy never filled this for the patient. Asked for it to be re sent. Please advise, would like [facility]

## 2023-07-10 NOTE — Telephone Encounter (Signed)
Mail box full not able to leave message.  °

## 2023-07-11 NOTE — Telephone Encounter (Signed)
Attempted to contact pt but his VM was full.

## 2023-07-19 ENCOUNTER — Other Ambulatory Visit: Payer: Self-pay | Admitting: Family Medicine

## 2023-09-25 ENCOUNTER — Other Ambulatory Visit: Payer: Self-pay | Admitting: Family Medicine

## 2023-10-29 ENCOUNTER — Telehealth: Payer: Self-pay | Admitting: Family Medicine

## 2023-10-29 NOTE — Telephone Encounter (Signed)
 Copied from CRM [id number]. Topic: Clinical - Medication Refill >> Oct 29, 2023  4:16 PM Elizebeth Brooking wrote: Most Recent Primary Care Visit:  Provider: Joaquim Nam  Department: [facility]  Visit Type: OFFICE VISIT  Date: 01/22/2023  Medication: colchicine 0.6 MG tablet  -- would like to know if they could change it for every day instead of every other day , wanted to know if the quantity can be increased   Has the patient contacted their pharmacy? Yes (Agent: If no, request that the patient contact the pharmacy for the refill. If patient does not wish to contact the pharmacy document the reason why and proceed with request.) (Agent: If yes, when and what did the pharmacy advise?)  Is this the correct pharmacy for this prescription? Yes If no, delete pharmacy and type the correct one.  This is the patient's preferred pharmacy:  [id number] [place], [place] - [address] AT [facility] [address] [place] [address] Phone: [phone] Fax: [phone]      Has the prescription been filled recently? No  Is the patient out of the medication? Yes  Has the patient been seen for an appointment in the last year OR does the patient have an upcoming appointment? Yes  Can we respond through MyChart? Yes  Agent: Please be advised that Rx refills may take up to 3 business days. We ask that you follow-up with your pharmacy.

## 2023-10-30 NOTE — Telephone Encounter (Signed)
 Patient has year supply of colchicine per chart. Patient's request, per call agent yesterday, was to increase the frequency/quantity of medication. Currently medication is prescribed to be taken every other day. Attempted to call patient for further follow up. No answer and voicemail box is full. Routing to the clinic.

## 2023-10-31 MED ORDER — COLCHICINE 0.6 MG PO TABS: 0.6000 mg | ORAL_TABLET | Freq: Every day | ORAL | 0 refills | Status: DC | PRN

## 2023-10-31 NOTE — Telephone Encounter (Signed)
 Called and spoke to patient. He states he is taking colchicine daily about 5-6 days a week. And when he has a flair up (1-2 times a year) he will take 3-4 times a day until symptoms improved. He states he is running out early every time due to how he is taking. Wanted to know if it can be changed to daily? Patient request that we call his wife with response. She is on dpr [phone]

## 2023-10-31 NOTE — Addendum Note (Signed)
 Addended by: Joaquim Nam on: 10/31/2023 11:37 PM   Modules accepted: Orders

## 2023-10-31 NOTE — Telephone Encounter (Signed)
 Sent but please have him get the follow up labs that are ordered in the EMR.  Thanks.

## 2023-10-31 NOTE — Telephone Encounter (Signed)
 Copied from CRM [id number]. Topic: General - Other >> Oct 31, 2023  9:23 AM Elizebeth Brooking wrote: Reason for CRM: Patient wife called in stated that if anyone has any other questions to call her work number  [phone] , she is unable to receive calls at her job on her personal cell phone

## 2023-11-01 NOTE — Telephone Encounter (Signed)
 Left message on VM on the number listed in the message.

## 2023-12-02 ENCOUNTER — Other Ambulatory Visit: Payer: Self-pay | Admitting: Family Medicine

## 2023-12-26 ENCOUNTER — Encounter: Payer: Self-pay | Admitting: Internal Medicine

## 2023-12-26 ENCOUNTER — Ambulatory Visit: Admitting: Internal Medicine

## 2023-12-26 VITALS — BP 130/72 | HR 74 | Temp 98.5°F | Ht 73.0 in | Wt 227.0 lb

## 2023-12-26 DIAGNOSIS — J22 Unspecified acute lower respiratory infection: Secondary | ICD-10-CM | POA: Diagnosis not present

## 2023-12-26 MED ORDER — AZITHROMYCIN 250 MG PO TABS: ORAL_TABLET | ORAL | 0 refills | Status: DC

## 2023-12-26 NOTE — Progress Notes (Signed)
 Subjective:    Patient ID: Steve Jones, male    DOB: [birth year], 68 y.o.   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6 mg total) by mouth daily as needed. 90 tablet 0   sildenafil  (REVATIO ) 20 MG tablet Take 1-5 tablets (20-100 mg total) by mouth daily as needed. 50 tablet 3   No current facility-administered medications on file prior to visit.    No Known Allergies  Past Medical History:  Diagnosis Date   Gout    Smoker     History reviewed. No pertinent surgical history.  Family History  Problem Relation Age of Onset   Colon cancer Father    Aneurysm Brother    Prostate cancer Neg Hx     Social History   Socioeconomic History   Marital status: Single    Spouse name: Not on file   Number of children: Not on file   Years of education: Not on file   Highest education level: Not on file  Occupational History   Not on file  Tobacco Use   Smoking status: Former    Types: Cigarettes   Smokeless tobacco: Former  Substance and Sexual Activity   Alcohol use: Yes    Comment: a few  per week.   Drug use: No   Sexual activity: Not on file  Other Topics Concern   Not on file  Social History Narrative   Married Terri Overbey   No kids.    From [place].   Worked in Theatre stage manager at [employer], now at [employer]   Social Drivers of Health   Financial Resource Strain: Not on file  Food Insecurity: Not on file  Transportation Needs: Not on file  Physical Activity: Not on file  Stress: Not on file  Social Connections: Not on file  Intimate Partner Violence: Not on file   Review of Systems No sig pollen allergies No N/V No loss of smell or taste Eating okay    Objective:   Physical Exam Constitutional:      Appearance: Normal appearance.  HENT:     Head:     Comments: No sinus tenderness    Ears:     Comments: TMs occluded with cerumen    Mouth/Throat:     Pharynx: No oropharyngeal exudate or posterior oropharyngeal erythema.  Pulmonary:     Effort: Pulmonary effort is normal.     Breath sounds: No wheezing or rales.     Comments: Slight upper airway  rhonchi Musculoskeletal:     Cervical back: Neck supple.  Lymphadenopathy:     Cervical: No cervical adenopathy.  Neurological:     Mental Status: He is alert.            Assessment & Plan:

## 2023-12-26 NOTE — Assessment & Plan Note (Signed)
 Ongoing illness after 10 days Sounds more bronchial than sinus Still smokes Will give azithromycin 

## 2024-02-27 ENCOUNTER — Other Ambulatory Visit: Payer: Self-pay | Admitting: Family Medicine

## 2024-02-28 ENCOUNTER — Telehealth: Payer: Self-pay | Admitting: Family Medicine

## 2024-02-28 ENCOUNTER — Telehealth: Payer: Self-pay

## 2024-02-28 NOTE — Telephone Encounter (Signed)
 Please call patient to schedule CPE with Dr. Cleatus. Thank you

## 2024-02-28 NOTE — Telephone Encounter (Signed)
 Error

## 2024-03-01 NOTE — Telephone Encounter (Signed)
 He has orders for uric acid, lipid panel, CMET that he should be able to use at [facility].  Thanks.

## 2024-03-02 NOTE — Telephone Encounter (Signed)
 Called patient to advise that lab orders are placed. Unable to leave a voicemail due to mailbox being full

## 2024-03-04 NOTE — Telephone Encounter (Signed)
Called patient unable to leave voicemail  

## 2024-03-09 NOTE — Telephone Encounter (Signed)
Called patient unable to leave voicemail due to mailbox being full.

## 2024-03-10 ENCOUNTER — Encounter: Payer: Self-pay | Admitting: Family Medicine

## 2024-03-10 NOTE — Telephone Encounter (Signed)
 Unable to reach patient.  Letter sent.

## 2024-03-18 ENCOUNTER — Other Ambulatory Visit: Payer: Self-pay | Admitting: Family Medicine

## 2024-03-18 DIAGNOSIS — E785 Hyperlipidemia, unspecified: Secondary | ICD-10-CM

## 2024-03-18 DIAGNOSIS — M109 Gout, unspecified: Secondary | ICD-10-CM

## 2024-03-18 DIAGNOSIS — R7989 Other specified abnormal findings of blood chemistry: Secondary | ICD-10-CM

## 2024-03-19 LAB — LIPID PANEL
Chol/HDL Ratio: 4.8 ratio (ref 0.0–5.0)
Cholesterol, Total: 214 mg/dL — ABNORMAL HIGH (ref 100–199)
HDL: 45 mg/dL (ref >39)
LDL Chol Calc (NIH): 137 mg/dL — ABNORMAL HIGH (ref 0–99)
Triglycerides: 180 mg/dL — ABNORMAL HIGH (ref 0–149)
VLDL Cholesterol Cal: 32 mg/dL (ref 5–40)

## 2024-03-19 LAB — COMPREHENSIVE METABOLIC PANEL WITH GFR
ALT: 52 IU/L — ABNORMAL HIGH (ref 0–44)
AST: 52 IU/L — ABNORMAL HIGH (ref 0–40)
Albumin: 4.3 g/dL (ref 3.9–4.9)
Alkaline Phosphatase: 86 IU/L (ref 44–121)
BUN/Creatinine Ratio: 11 (ref 10–24)
BUN: 14 mg/dL (ref 8–27)
Bilirubin Total: 0.4 mg/dL (ref 0.0–1.2)
CO2: 22 mmol/L (ref 20–29)
Calcium: 9.3 mg/dL (ref 8.6–10.2)
Chloride: 101 mmol/L (ref 96–106)
Creatinine, Ser: 1.25 mg/dL (ref 0.76–1.27)
Globulin, Total: 2.8 g/dL (ref 1.5–4.5)
Glucose: 103 mg/dL — ABNORMAL HIGH (ref 70–99)
Potassium: 4.2 mmol/L (ref 3.5–5.2)
Sodium: 138 mmol/L (ref 134–144)
Total Protein: 7.1 g/dL (ref 6.0–8.5)
eGFR: 63 mL/min/1.73 (ref >59)

## 2024-03-19 LAB — URIC ACID: Uric Acid: 7.8 mg/dL (ref 3.8–8.4)

## 2024-03-23 ENCOUNTER — Ambulatory Visit: Payer: Self-pay | Admitting: Family Medicine

## 2024-03-27 ENCOUNTER — Ambulatory Visit (INDEPENDENT_AMBULATORY_CARE_PROVIDER_SITE_OTHER): Admitting: Family Medicine

## 2024-03-27 ENCOUNTER — Encounter: Payer: Self-pay | Admitting: Family Medicine

## 2024-03-27 VITALS — BP 136/68 | HR 73 | Temp 98.7°F | Ht 72.52 in | Wt 228.0 lb

## 2024-03-27 DIAGNOSIS — M109 Gout, unspecified: Secondary | ICD-10-CM

## 2024-03-27 DIAGNOSIS — H9390 Unspecified disorder of ear, unspecified ear: Secondary | ICD-10-CM

## 2024-03-27 DIAGNOSIS — E785 Hyperlipidemia, unspecified: Secondary | ICD-10-CM

## 2024-03-27 DIAGNOSIS — I10 Essential (primary) hypertension: Secondary | ICD-10-CM

## 2024-03-27 DIAGNOSIS — K760 Fatty (change of) liver, not elsewhere classified: Secondary | ICD-10-CM

## 2024-03-27 DIAGNOSIS — Z0000 Encounter for general adult medical examination without abnormal findings: Secondary | ICD-10-CM | POA: Diagnosis not present

## 2024-03-27 DIAGNOSIS — Z7189 Other specified counseling: Secondary | ICD-10-CM

## 2024-03-27 MED ORDER — SILDENAFIL CITRATE 20 MG PO TABS: 20.0000 mg | ORAL_TABLET | Freq: Every day | ORAL | 3 refills | Status: AC | PRN

## 2024-03-27 MED ORDER — ALBUTEROL SULFATE HFA 108 (90 BASE) MCG/ACT IN AERS: 1.0000 | INHALATION_SPRAY | Freq: Four times a day (QID) | RESPIRATORY_TRACT | 1 refills | Status: AC | PRN

## 2024-03-27 MED ORDER — ATORVASTATIN CALCIUM 10 MG PO TABS: 10.0000 mg | ORAL_TABLET | Freq: Every day | ORAL | 3 refills | Status: AC

## 2024-03-27 MED ORDER — COLCHICINE 0.6 MG PO TABS: 0.6000 mg | ORAL_TABLET | Freq: Every day | ORAL | 0 refills | Status: DC | PRN

## 2024-03-27 MED ORDER — AMLODIPINE BESYLATE 5 MG PO TABS: 5.0000 mg | ORAL_TABLET | Freq: Every day | ORAL | 3 refills | Status: AC

## 2024-03-27 MED ORDER — ALLOPURINOL 100 MG PO TABS: 50.0000 mg | ORAL_TABLET | Freq: Every day | ORAL | 3 refills | Status: AC

## 2024-03-27 MED ORDER — DOXYCYCLINE HYCLATE 100 MG PO TABS: 100.0000 mg | ORAL_TABLET | Freq: Two times a day (BID) | ORAL | 0 refills | Status: AC

## 2024-03-27 NOTE — Patient Instructions (Addendum)
 Let me know when you want to get a colonoscopy set up.   Take care.  Glad to see you. Start doxycycline , warm compresses, refer to ENT.  Let me know/get rechecked if your ear is worse in the meantime.

## 2024-03-27 NOTE — Progress Notes (Signed)
 CPE- See plan.  Routine anticipatory guidance given to patient.  See health maintenance.  The possibility exists that previously documented standard health maintenance information may have been brought forward from a previous encounter into this note.  If needed, that same information has been updated to reflect the current situation based on today's encounter.    Tetanus d/w pt.  He can check on coverage.  Encouraged. Flu to be done.  Pneumonia vaccine 2023 Shingles vaccination discussed with patient.  Colon cancer screening d/w pt. Colonoscopy encouraged.  FH noted.   Prostate cancer screening and PSA options (with potential risks and benefits of testing vs not testing) were discussed along with recent recs/guidelines.  He declined testing PSA at this point. Diet and exercise discussed with patient.  Living will discussed with patient. Steve Jones designated if patient were incapacitated. AAA screening d/w pt. He can let me know when he wants to proceed.   Gout. No recent flares.  Still on allopurinol .  Using cochicine if needed.  Labs d/w pt.  Hypertension:    Using medication without problems or lightheadedness: yes Chest pain with exertion:no Edema:no Short of breath:no  Elevated Cholesterol: Using medications without problems:yes Muscle aches: no  Diet compliance: yes Exercise: yes Labs d/w pt.    L ear lesion.  Sore to the touch.  Started a few days ago.  No fevers.  No drainage until yesterday with purulent discharge.    H/o LFT elevation, h/o fatty liver.  No jaundice. No abd pain.    PMH and SH reviewed  Meds, vitals, and allergies reviewed.   ROS: Per HPI.  Unless specifically indicated otherwise in HPI, the patient denies:  General: fever. Eyes: acute vision changes ENT: sore throat Cardiovascular: chest pain Respiratory: SOB GI: vomiting GU: dysuria Musculoskeletal: acute back pain Derm: acute rash Neuro: acute motor dysfunction Psych: worsening  mood Endocrine: polydipsia Heme: bleeding Allergy: hayfever  GEN: nad, alert and oriented HEENT: mucous membranes moist NECK: supple w/o LA CV: rrr. PULM: ctab, no inc wob ABD: soft, +bs EXT: no edema SKIN: no acute rash Bilateral cerumen in the canals.  Right pinna normal.  Left pinna with swelling and erythema in the lobule with irritated lesion superiorly in the antihelix No associated lymphadenopathy.

## 2024-03-29 DIAGNOSIS — K760 Fatty (change of) liver, not elsewhere classified: Secondary | ICD-10-CM | POA: Insufficient documentation

## 2024-03-29 NOTE — Assessment & Plan Note (Signed)
Labs discussed with patient.  Continue amlodipine.

## 2024-03-29 NOTE — Assessment & Plan Note (Signed)
 No recent flares.  Still on allopurinol .  Using cochicine if needed.  Labs d/w pt. Continue as is.

## 2024-03-29 NOTE — Assessment & Plan Note (Signed)
Labs discussed with patient.  Continue atorvastatin. 

## 2024-03-29 NOTE — Assessment & Plan Note (Signed)
 H/o LFT elevation, h/o fatty liver.  No jaundice. No abd pain.   Labs similar to prior.  Discussed diet and exercise.

## 2024-03-29 NOTE — Assessment & Plan Note (Signed)
 Living will discussed with patient. Steve Jones designated if patient were incapacitated.

## 2024-03-29 NOTE — Assessment & Plan Note (Signed)
 Tetanus d/w pt.  He can check on coverage.  Encouraged. Flu to be done.  Pneumonia vaccine 2023 Shingles vaccination discussed with patient.  Colon cancer screening d/w pt. Colonoscopy encouraged.  FH noted.   Prostate cancer screening and PSA options (with potential risks and benefits of testing vs not testing) were discussed along with recent recs/guidelines.  He declined testing PSA at this point. Diet and exercise discussed with patient.  Living will discussed with patient. [name] designated if patient were incapacitated. AAA screening d/w pt. He can let me know when he wants to proceed.

## 2024-03-29 NOTE — Assessment & Plan Note (Addendum)
 Refer to ENT.  I do not think it is reasonable to try to drain this lesion.  I want ENT input.  It looks at minimum irritated but without purulent drainage now.  Discussed options.  Start Doxy with routine cautions, use warm compresses and follow-up with ENT.  He will let me know if he cannot get set up with ENT.  He agrees with plan.

## 2024-06-02 ENCOUNTER — Other Ambulatory Visit: Payer: Self-pay | Admitting: Family Medicine

## 2024-06-02 DIAGNOSIS — M109 Gout, unspecified: Secondary | ICD-10-CM

## 2024-06-03 NOTE — Telephone Encounter (Signed)
 Colchicine  Last filled:  04/08/24, #90 Last OV:  03/27/24, CPE Next OV:  none

## 2024-07-10 ENCOUNTER — Ambulatory Visit: Payer: Self-pay

## 2024-07-10 NOTE — Telephone Encounter (Signed)
 Please have him check BP at home when at rest.  Thanks.

## 2024-07-10 NOTE — Telephone Encounter (Signed)
 Lvm asking pt to call back. Pls relay Dr Elfredia message.

## 2024-07-10 NOTE — Telephone Encounter (Signed)
 FYI Only or Action Required?: Action required by provider: clinical question for provider.  Patient was last seen in primary care on 03/27/2024 by Cleatus Arlyss RAMAN, MD.  Called Nurse Triage reporting Hypertension.  Symptoms began today.  Interventions attempted: Prescription medications: amlodipine .  Symptoms are: unchanged.  Triage Disposition: See PCP Within 2 Weeks  Patient/caregiver understands and will follow disposition?: Yes   Copied from CRM #[id number]. Topic: Clinical - Red Word Triage >> Jul 10, 2024 12:41 PM Carlyon D wrote: Red Word that prompted transfer to Nurse Triage: pt was about to get surgery on mouth and they could not extract his teeth due to High blood pressure. 140/110   Need surgical clearance;todya Reason for Disposition  [1] Systolic BP >= 130 OR Diastolic >= 80 AND [2] taking BP medications    asymptomatic  Answer Assessment - Initial Assessment Questions Patient was unable to get tooth exactrations today due to elevated BP. Patient requesting surgical clearance.  No available appts today, scheduled 07/13/24.  Advised UC or ED/911 if symptoms worsen. Patient and pt's wife verbalized understanding. 1. BLOOD PRESSURE: What is your blood pressure? Did you take at least two measurements 5 minutes apart?     At dentist office 140/101 2. ONSET: When did you take your blood pressure?     today 3. HOW: How did you take your blood pressure? (e.g., automatic home BP monitor, visiting nurse)     Does not monitor 4. HISTORY: Do you have a history of high blood pressure?     htn 5. MEDICINES: Are you taking any medicines for blood pressure? Have you missed any doses recently?     Amlodipine , last taken last night 6. OTHER SYMPTOMS: Do you have any symptoms? (e.g., blurred vision, chest pain, difficulty breathing, headache, weakness) denies  Protocols used: Blood Pressure - High-A-AH

## 2024-07-13 ENCOUNTER — Ambulatory Visit: Admitting: Family Medicine

## 2024-07-13 NOTE — Telephone Encounter (Signed)
 LM for pt to return my call.  Please provide message from provider/office when call is returned from patient.

## 2024-07-14 NOTE — Telephone Encounter (Signed)
 LM for pt to return my call

## 2024-07-20 NOTE — Telephone Encounter (Signed)
 Called patient left message. Not able to send my chart have called over 3 times ok to mail letter and close encounter.

## 2024-07-20 NOTE — Telephone Encounter (Signed)
 Please send letter. Thanks.

## 2024-07-21 NOTE — Telephone Encounter (Signed)
 Letter sent
